# Patient Record
Sex: Male | Born: 1960 | Marital: Single
Health system: Southern US, Community
[De-identification: ages and names within clinical notes are randomized; demographics above are authoritative.]

---

## 2007-10-02 ENCOUNTER — Other Ambulatory Visit: Payer: Self-pay

## 2007-10-02 ENCOUNTER — Inpatient Hospital Stay: Payer: Self-pay | Admitting: *Deleted

## 2007-10-03 ENCOUNTER — Other Ambulatory Visit: Payer: Self-pay

## 2007-10-17 ENCOUNTER — Ambulatory Visit: Payer: Self-pay | Admitting: Family

## 2011-07-06 ENCOUNTER — Inpatient Hospital Stay: Payer: Self-pay | Admitting: Internal Medicine

## 2011-08-13 ENCOUNTER — Emergency Department: Payer: Self-pay | Admitting: Emergency Medicine

## 2011-08-13 LAB — URINALYSIS, COMPLETE
Bacteria: NONE SEEN
Blood: NEGATIVE
Glucose,UR: NEGATIVE mg/dL (ref 0–75)
Ketone: NEGATIVE
Leukocyte Esterase: NEGATIVE
Nitrite: NEGATIVE
Protein: NEGATIVE
RBC,UR: 1 /HPF (ref 0–5)
Squamous Epithelial: NONE SEEN

## 2011-08-13 LAB — CBC
HGB: 14.6 g/dL (ref 13.0–18.0)
MCHC: 33.2 g/dL (ref 32.0–36.0)
Platelet: 149 10*3/uL — ABNORMAL LOW (ref 150–440)
RBC: 4.52 10*6/uL (ref 4.40–5.90)
RDW: 15.1 % — ABNORMAL HIGH (ref 11.5–14.5)
WBC: 12.3 10*3/uL — ABNORMAL HIGH (ref 3.8–10.6)

## 2011-08-13 LAB — COMPREHENSIVE METABOLIC PANEL
Alkaline Phosphatase: 46 U/L — ABNORMAL LOW (ref 50–136)
Anion Gap: 10 (ref 7–16)
Bilirubin,Total: 1.9 mg/dL — ABNORMAL HIGH (ref 0.2–1.0)
Calcium, Total: 8.9 mg/dL (ref 8.5–10.1)
Co2: 29 mmol/L (ref 21–32)
Creatinine: 1.37 mg/dL — ABNORMAL HIGH (ref 0.60–1.30)
EGFR (African American): >60
EGFR (Non-African Amer.): 59 — ABNORMAL LOW
Glucose: 103 mg/dL — ABNORMAL HIGH (ref 65–99)
Sodium: 138 mmol/L (ref 136–145)

## 2011-08-13 LAB — PRO B NATRIURETIC PEPTIDE: B-Type Natriuretic Peptide: 7681 pg/mL — ABNORMAL HIGH (ref 0–125)

## 2011-08-13 LAB — CK TOTAL AND CKMB (NOT AT ARMC): CK, Total: 109 U/L (ref 35–232)

## 2011-08-13 LAB — DIGOXIN LEVEL: Digoxin: 1.59 ng/mL

## 2011-10-08 ENCOUNTER — Inpatient Hospital Stay: Payer: Self-pay | Admitting: Internal Medicine

## 2011-10-08 LAB — COMPREHENSIVE METABOLIC PANEL
Albumin: 3.6 g/dL (ref 3.4–5.0)
Alkaline Phosphatase: 64 U/L (ref 50–136)
Anion Gap: 11 (ref 7–16)
BUN: 27 mg/dL — ABNORMAL HIGH (ref 7–18)
Creatinine: 1.49 mg/dL — ABNORMAL HIGH (ref 0.60–1.30)
Glucose: 137 mg/dL — ABNORMAL HIGH (ref 65–99)
Osmolality: 277 (ref 275–301)
Potassium: 4.4 mmol/L (ref 3.5–5.1)
SGOT(AST): 201 U/L — ABNORMAL HIGH (ref 15–37)
Sodium: 135 mmol/L — ABNORMAL LOW (ref 136–145)
Total Protein: 6.9 g/dL (ref 6.4–8.2)

## 2011-10-08 LAB — CBC
HCT: 42.5 % (ref 40.0–52.0)
HGB: 13.7 g/dL (ref 13.0–18.0)
MCHC: 32.2 g/dL (ref 32.0–36.0)
MCV: 96 fL (ref 80–100)
Platelet: 190 10*3/uL (ref 150–440)
RDW: 17.7 % — ABNORMAL HIGH (ref 11.5–14.5)
WBC: 9.9 10*3/uL (ref 3.8–10.6)

## 2011-10-08 LAB — URINALYSIS, COMPLETE
Blood: NEGATIVE
Leukocyte Esterase: NEGATIVE
Nitrite: NEGATIVE
Ph: 5 (ref 4.5–8.0)
Protein: 30
RBC,UR: <1 /HPF (ref 0–5)

## 2011-10-08 LAB — RAPID INFLUENZA A&B ANTIGENS

## 2011-10-08 LAB — CK TOTAL AND CKMB (NOT AT ARMC)
CK, Total: 127 U/L (ref 35–232)
CK-MB: 1.8 ng/mL (ref 0.5–3.6)

## 2011-10-08 LAB — DIGOXIN LEVEL: Digoxin: 0.96 ng/mL

## 2011-10-08 LAB — TROPONIN I: Troponin-I: 0.03 ng/mL

## 2011-10-09 LAB — COMPREHENSIVE METABOLIC PANEL
Albumin: 3.1 g/dL — ABNORMAL LOW (ref 3.4–5.0)
Alkaline Phosphatase: 55 U/L (ref 50–136)
Anion Gap: 13 (ref 7–16)
BUN: 27 mg/dL — ABNORMAL HIGH (ref 7–18)
Bilirubin,Total: 2.4 mg/dL — ABNORMAL HIGH (ref 0.2–1.0)
Creatinine: 1.56 mg/dL — ABNORMAL HIGH (ref 0.60–1.30)
EGFR (Non-African Amer.): 51 — ABNORMAL LOW
Osmolality: 283 (ref 275–301)
Sodium: 139 mmol/L (ref 136–145)

## 2011-10-09 LAB — BASIC METABOLIC PANEL
Anion Gap: 13 (ref 7–16)
BUN: 28 mg/dL — ABNORMAL HIGH (ref 7–18)
Chloride: 99 mmol/L (ref 98–107)
Co2: 26 mmol/L (ref 21–32)
Creatinine: 1.46 mg/dL — ABNORMAL HIGH (ref 0.60–1.30)
EGFR (African American): >60
Glucose: 104 mg/dL — ABNORMAL HIGH (ref 65–99)

## 2011-10-09 LAB — CBC WITH DIFFERENTIAL/PLATELET
Basophil #: 0.1 10*3/uL (ref 0.0–0.1)
Basophil %: 0.9 %
Eosinophil #: 0.1 10*3/uL (ref 0.0–0.7)
HGB: 12.7 g/dL — ABNORMAL LOW (ref 13.0–18.0)
Lymphocyte %: 25.4 %
MCH: 30.8 pg (ref 26.0–34.0)
MCHC: 32.5 g/dL (ref 32.0–36.0)
Monocyte #: 0.9 10*3/uL — ABNORMAL HIGH (ref 0.0–0.7)
Neutrophil %: 62.9 %
Platelet: 170 10*3/uL (ref 150–440)
RDW: 17.3 % — ABNORMAL HIGH (ref 11.5–14.5)

## 2011-10-09 LAB — CK TOTAL AND CKMB (NOT AT ARMC)
CK, Total: 123 U/L (ref 35–232)
CK-MB: 1.3 ng/mL (ref 0.5–3.6)

## 2011-10-09 LAB — MAGNESIUM: Magnesium: 1.4 mg/dL — ABNORMAL LOW

## 2011-10-09 LAB — TROPONIN I: Troponin-I: 0.03 ng/mL

## 2011-10-10 LAB — CK TOTAL AND CKMB (NOT AT ARMC): CK-MB: 1.1 ng/mL (ref 0.5–3.6)

## 2011-10-12 LAB — CREATININE, SERUM
EGFR (African American): 54 — ABNORMAL LOW
EGFR (Non-African Amer.): 44 — ABNORMAL LOW

## 2011-10-13 LAB — BASIC METABOLIC PANEL
Anion Gap: 16 (ref 7–16)
BUN: 48 mg/dL — ABNORMAL HIGH (ref 7–18)
Chloride: 96 mmol/L — ABNORMAL LOW (ref 98–107)
Creatinine: 2.24 mg/dL — ABNORMAL HIGH (ref 0.60–1.30)
EGFR (Non-African Amer.): 33 — ABNORMAL LOW
Glucose: 153 mg/dL — ABNORMAL HIGH (ref 65–99)
Sodium: 133 mmol/L — ABNORMAL LOW (ref 136–145)

## 2011-10-14 LAB — BASIC METABOLIC PANEL
Anion Gap: 20 — ABNORMAL HIGH (ref 7–16)
BUN: 61 mg/dL — ABNORMAL HIGH (ref 7–18)
BUN: 62 mg/dL — ABNORMAL HIGH (ref 7–18)
Calcium, Total: 8.3 mg/dL — ABNORMAL LOW (ref 8.5–10.1)
Calcium, Total: 8.3 mg/dL — ABNORMAL LOW (ref 8.5–10.1)
Co2: 14 mmol/L — ABNORMAL LOW (ref 21–32)
Co2: 20 mmol/L — ABNORMAL LOW (ref 21–32)
EGFR (African American): 37 — ABNORMAL LOW
EGFR (African American): 45 — ABNORMAL LOW
Glucose: 104 mg/dL — ABNORMAL HIGH (ref 65–99)
Osmolality: 278 (ref 275–301)
Osmolality: 279 (ref 275–301)
Potassium: 4.2 mmol/L (ref 3.5–5.1)
Sodium: 130 mmol/L — ABNORMAL LOW (ref 136–145)
Sodium: 129 mmol/L — ABNORMAL LOW (ref 136–145)

## 2011-10-14 LAB — PROTEIN / CREATININE RATIO, URINE
Protein, Random Urine: 53 mg/dL — ABNORMAL HIGH (ref 0–12)
Protein/Creat. Ratio: 211 mg/gCREAT — ABNORMAL HIGH (ref 0–200)

## 2011-10-15 LAB — BASIC METABOLIC PANEL
Anion Gap: 14 (ref 7–16)
BUN: 52 mg/dL — ABNORMAL HIGH (ref 7–18)
Calcium, Total: 8.3 mg/dL — ABNORMAL LOW (ref 8.5–10.1)
Chloride: 96 mmol/L — ABNORMAL LOW (ref 98–107)
Co2: 21 mmol/L (ref 21–32)
Creatinine: 1.55 mg/dL — ABNORMAL HIGH (ref 0.60–1.30)
EGFR (Non-African Amer.): 51 — ABNORMAL LOW
Potassium: 3.7 mmol/L (ref 3.5–5.1)
Sodium: 131 mmol/L — ABNORMAL LOW (ref 136–145)

## 2011-10-15 LAB — STOOL CULTURE

## 2011-10-16 LAB — CBC WITH DIFFERENTIAL/PLATELET
Basophil #: 0 10*3/uL (ref 0.0–0.1)
Eosinophil #: 0.1 10*3/uL (ref 0.0–0.7)
Eosinophil %: 0.8 %
HCT: 42.3 % (ref 40.0–52.0)
Lymphocyte #: 2.8 10*3/uL (ref 1.0–3.6)
MCH: 30.5 pg (ref 26.0–34.0)
MCV: 95 fL (ref 80–100)
Monocyte #: 1.8 10*3/uL — ABNORMAL HIGH (ref 0.0–0.7)
Monocyte %: 12.7 %
Neutrophil #: 9.3 10*3/uL — ABNORMAL HIGH (ref 1.4–6.5)
Neutrophil %: 66.3 %
RBC: 4.48 10*6/uL (ref 4.40–5.90)
RDW: 18.3 % — ABNORMAL HIGH (ref 11.5–14.5)
WBC: 14 10*3/uL — ABNORMAL HIGH (ref 3.8–10.6)

## 2011-10-16 LAB — BASIC METABOLIC PANEL
Anion Gap: 13 (ref 7–16)
Calcium, Total: 8.5 mg/dL (ref 8.5–10.1)
Chloride: 96 mmol/L — ABNORMAL LOW (ref 98–107)
Creatinine: 1.6 mg/dL — ABNORMAL HIGH (ref 0.60–1.30)
EGFR (African American): 60 — ABNORMAL LOW
EGFR (Non-African Amer.): 49 — ABNORMAL LOW
Glucose: 101 mg/dL — ABNORMAL HIGH (ref 65–99)
Sodium: 130 mmol/L — ABNORMAL LOW (ref 136–145)

## 2011-10-17 LAB — BASIC METABOLIC PANEL
BUN: 54 mg/dL — ABNORMAL HIGH (ref 7–18)
Chloride: 94 mmol/L — ABNORMAL LOW (ref 98–107)
Co2: 21 mmol/L (ref 21–32)
Creatinine: 1.66 mg/dL — ABNORMAL HIGH (ref 0.60–1.30)
EGFR (African American): 57 — ABNORMAL LOW
Glucose: 109 mg/dL — ABNORMAL HIGH (ref 65–99)
Osmolality: 271 (ref 275–301)
Sodium: 127 mmol/L — ABNORMAL LOW (ref 136–145)

## 2011-10-18 LAB — PROTEIN ELECTROPHORESIS(ARMC)

## 2011-10-18 LAB — BASIC METABOLIC PANEL
Anion Gap: 12 (ref 7–16)
BUN: 54 mg/dL — ABNORMAL HIGH (ref 7–18)
Calcium, Total: 8.5 mg/dL (ref 8.5–10.1)
Chloride: 93 mmol/L — ABNORMAL LOW (ref 98–107)
Glucose: 111 mg/dL — ABNORMAL HIGH (ref 65–99)

## 2011-10-18 LAB — UR PROT ELECTROPHORESIS, URINE RANDOM

## 2011-10-19 LAB — BASIC METABOLIC PANEL
Chloride: 92 mmol/L — ABNORMAL LOW (ref 98–107)
Co2: 23 mmol/L (ref 21–32)
Creatinine: 1.72 mg/dL — ABNORMAL HIGH (ref 0.60–1.30)
EGFR (African American): 55 — ABNORMAL LOW
EGFR (Non-African Amer.): 45 — ABNORMAL LOW
Glucose: 102 mg/dL — ABNORMAL HIGH (ref 65–99)
Osmolality: 268 (ref 275–301)
Potassium: 4.8 mmol/L (ref 3.5–5.1)
Sodium: 126 mmol/L — ABNORMAL LOW (ref 136–145)

## 2011-10-19 LAB — RAPID HIV-1/2 QL/CONFIRM: HIV-1/2,Rapid Ql: NEGATIVE

## 2011-10-20 LAB — BASIC METABOLIC PANEL
Anion Gap: 16 (ref 7–16)
Chloride: 94 mmol/L — ABNORMAL LOW (ref 98–107)
Co2: 13 mmol/L — ABNORMAL LOW (ref 21–32)
Creatinine: 1.51 mg/dL — ABNORMAL HIGH (ref 0.60–1.30)
EGFR (African American): >60
EGFR (Non-African Amer.): 53 — ABNORMAL LOW
Potassium: 5.7 mmol/L — ABNORMAL HIGH (ref 3.5–5.1)
Sodium: 123 mmol/L — ABNORMAL LOW (ref 136–145)

## 2011-10-21 LAB — BASIC METABOLIC PANEL
Anion Gap: 17 — ABNORMAL HIGH (ref 7–16)
BUN: 63 mg/dL — ABNORMAL HIGH (ref 7–18)
Calcium, Total: 8.4 mg/dL — ABNORMAL LOW (ref 8.5–10.1)
Chloride: 91 mmol/L — ABNORMAL LOW (ref 98–107)
Co2: 13 mmol/L — ABNORMAL LOW (ref 21–32)
Creatinine: 2.06 mg/dL — ABNORMAL HIGH (ref 0.60–1.30)
EGFR (African American): 45 — ABNORMAL LOW
EGFR (Non-African Amer.): 37 — ABNORMAL LOW
Osmolality: 262 (ref 275–301)
Potassium: 5.7 mmol/L — ABNORMAL HIGH (ref 3.5–5.1)
Sodium: 121 mmol/L — ABNORMAL LOW (ref 136–145)

## 2011-10-21 LAB — CULTURE, BLOOD (SINGLE)

## 2011-10-22 LAB — COMPREHENSIVE METABOLIC PANEL
Anion Gap: 13 (ref 7–16)
BUN: 65 mg/dL — ABNORMAL HIGH (ref 7–18)
Bilirubin,Total: 5 mg/dL — ABNORMAL HIGH (ref 0.2–1.0)
Calcium, Total: 8 mg/dL — ABNORMAL LOW (ref 8.5–10.1)
Chloride: 90 mmol/L — ABNORMAL LOW (ref 98–107)
Creatinine: 2.2 mg/dL — ABNORMAL HIGH (ref 0.60–1.30)
EGFR (African American): 41 — ABNORMAL LOW
EGFR (Non-African Amer.): 34 — ABNORMAL LOW
Glucose: 150 mg/dL — ABNORMAL HIGH (ref 65–99)
Potassium: 5.1 mmol/L (ref 3.5–5.1)
SGOT(AST): 753 U/L — ABNORMAL HIGH (ref 15–37)
SGPT (ALT): 893 U/L — ABNORMAL HIGH
Sodium: 125 mmol/L — ABNORMAL LOW (ref 136–145)
Total Protein: 5.9 g/dL — ABNORMAL LOW (ref 6.4–8.2)

## 2011-10-22 LAB — CBC WITH DIFFERENTIAL/PLATELET
Basophil %: 0.4 %
Eosinophil #: 0 10*3/uL (ref 0.0–0.7)
HCT: 39.9 % — ABNORMAL LOW (ref 40.0–52.0)
HGB: 12.8 g/dL — ABNORMAL LOW (ref 13.0–18.0)
Lymphocyte #: 1.2 10*3/uL (ref 1.0–3.6)
MCHC: 32.2 g/dL (ref 32.0–36.0)
MCV: 93 fL (ref 80–100)
Monocyte #: 0.8 10*3/uL — ABNORMAL HIGH (ref 0.0–0.7)
Monocyte %: 5.5 %
Neutrophil %: 85.9 %
Platelet: 123 10*3/uL — ABNORMAL LOW (ref 150–440)
RBC: 4.3 10*6/uL — ABNORMAL LOW (ref 4.40–5.90)
WBC: 14.8 10*3/uL — ABNORMAL HIGH (ref 3.8–10.6)

## 2011-10-22 LAB — SEDIMENTATION RATE: Erythrocyte Sed Rate: 1 mm/hr (ref 0–15)

## 2011-10-23 LAB — BASIC METABOLIC PANEL
BUN: 48 mg/dL — ABNORMAL HIGH (ref 7–18)
Calcium, Total: 8 mg/dL — ABNORMAL LOW (ref 8.5–10.1)
Chloride: 91 mmol/L — ABNORMAL LOW (ref 98–107)
EGFR (Non-African Amer.): 48 — ABNORMAL LOW
Osmolality: 272 (ref 275–301)
Potassium: 5.1 mmol/L (ref 3.5–5.1)

## 2011-10-23 LAB — EXPECTORATED SPUTUM ASSESSMENT W GRAM STAIN, RFLX TO RESP C

## 2011-10-23 LAB — CBC WITH DIFFERENTIAL/PLATELET
Basophil #: 0 10*3/uL (ref 0.0–0.1)
Basophil %: 0.1 %
HGB: 12.3 g/dL — ABNORMAL LOW (ref 13.0–18.0)
Lymphocyte #: 1.3 10*3/uL (ref 1.0–3.6)
Lymphocyte %: 6.1 %
MCH: 29.8 pg (ref 26.0–34.0)
MCV: 92 fL (ref 80–100)
Monocyte #: 1.8 10*3/uL — ABNORMAL HIGH (ref 0.0–0.7)
Monocyte %: 8.4 %
Platelet: 84 10*3/uL — ABNORMAL LOW (ref 150–440)
RBC: 4.13 10*6/uL — ABNORMAL LOW (ref 4.40–5.90)
RDW: 18.6 % — ABNORMAL HIGH (ref 11.5–14.5)
WBC: 21.4 10*3/uL — ABNORMAL HIGH (ref 3.8–10.6)

## 2011-10-24 ENCOUNTER — Ambulatory Visit: Payer: Self-pay | Admitting: Neurology

## 2011-10-24 LAB — RENAL FUNCTION PANEL
Albumin: 2.7 g/dL — ABNORMAL LOW (ref 3.4–5.0)
Anion Gap: 11 (ref 7–16)
BUN: 54 mg/dL — ABNORMAL HIGH (ref 7–18)
Chloride: 92 mmol/L — ABNORMAL LOW (ref 98–107)
Creatinine: 1.92 mg/dL — ABNORMAL HIGH (ref 0.60–1.30)
EGFR (Non-African Amer.): 40 — ABNORMAL LOW
Glucose: 130 mg/dL — ABNORMAL HIGH (ref 65–99)
Osmolality: 275 (ref 275–301)
Phosphorus: 4.5 mg/dL (ref 2.5–4.9)
Potassium: 5.9 mmol/L — ABNORMAL HIGH (ref 3.5–5.1)

## 2011-10-24 LAB — IRON AND TIBC: Iron Bind.Cap.(Total): 303 ug/dL (ref 250–450)

## 2011-10-24 LAB — CBC WITH DIFFERENTIAL/PLATELET
Basophil #: 0 10*3/uL (ref 0.0–0.1)
Eosinophil %: 0.1 %
HCT: 41.5 % (ref 40.0–52.0)
HGB: 13 g/dL (ref 13.0–18.0)
Lymphocyte #: 1.7 10*3/uL (ref 1.0–3.6)
Lymphocyte %: 6.8 %
Monocyte %: 5.1 %
Neutrophil %: 87.9 %
Platelet: 70 10*3/uL — ABNORMAL LOW (ref 150–440)
RDW: 17.8 % — ABNORMAL HIGH (ref 11.5–14.5)

## 2011-10-24 LAB — PROTIME-INR
INR: 1.7
Prothrombin Time: 20.5 secs — ABNORMAL HIGH (ref 11.5–14.7)

## 2011-10-24 LAB — URINALYSIS, COMPLETE
Bilirubin,UR: NEGATIVE
Blood: NEGATIVE
Glucose,UR: NEGATIVE mg/dL (ref 0–75)
Ketone: NEGATIVE
Leukocyte Esterase: NEGATIVE
Ph: 5 (ref 4.5–8.0)
Protein: NEGATIVE
RBC,UR: <1 /HPF (ref 0–5)
Squamous Epithelial: <1
WBC UR: 1 /HPF (ref 0–5)

## 2011-10-24 LAB — COMPREHENSIVE METABOLIC PANEL
Anion Gap: 12 (ref 7–16)
BUN: 54 mg/dL — ABNORMAL HIGH (ref 7–18)
Chloride: 91 mmol/L — ABNORMAL LOW (ref 98–107)
Creatinine: 1.93 mg/dL — ABNORMAL HIGH (ref 0.60–1.30)
EGFR (African American): 48 — ABNORMAL LOW
Glucose: 129 mg/dL — ABNORMAL HIGH (ref 65–99)
SGOT(AST): 254 U/L — ABNORMAL HIGH (ref 15–37)
SGPT (ALT): 567 U/L — ABNORMAL HIGH
Sodium: 129 mmol/L — ABNORMAL LOW (ref 136–145)
Total Protein: 6.2 g/dL — ABNORMAL LOW (ref 6.4–8.2)

## 2011-10-24 LAB — FIBRINOGEN: Fibrinogen: 193 mg/dL — ABNORMAL LOW (ref 210–470)

## 2011-10-25 LAB — COMPREHENSIVE METABOLIC PANEL
Albumin: 2.5 g/dL — ABNORMAL LOW (ref 3.4–5.0)
Calcium, Total: 8 mg/dL — ABNORMAL LOW (ref 8.5–10.1)
Chloride: 88 mmol/L — ABNORMAL LOW (ref 98–107)
Creatinine: 1.9 mg/dL — ABNORMAL HIGH (ref 0.60–1.30)
EGFR (African American): 49 — ABNORMAL LOW
Osmolality: 276 (ref 275–301)
Potassium: 5.2 mmol/L — ABNORMAL HIGH (ref 3.5–5.1)
SGOT(AST): 175 U/L — ABNORMAL HIGH (ref 15–37)
Sodium: 127 mmol/L — ABNORMAL LOW (ref 136–145)
Total Protein: 5.8 g/dL — ABNORMAL LOW (ref 6.4–8.2)

## 2011-10-25 LAB — CBC WITH DIFFERENTIAL/PLATELET
Eosinophil #: 0 10*3/uL (ref 0.0–0.7)
HCT: 38 % — ABNORMAL LOW (ref 40.0–52.0)
Lymphocyte %: 5.6 %
MCH: 29.6 pg (ref 26.0–34.0)
Monocyte #: 1.4 10*3/uL — ABNORMAL HIGH (ref 0.0–0.7)
Monocyte %: 6.2 %
Neutrophil #: 20.1 10*3/uL — ABNORMAL HIGH (ref 1.4–6.5)
Neutrophil %: 88 %
RBC: 4.11 10*6/uL — ABNORMAL LOW (ref 4.40–5.90)

## 2011-10-25 LAB — RENAL FUNCTION PANEL: Phosphorus: 4.2 mg/dL (ref 2.5–4.9)

## 2011-11-01 LAB — CULTURE, BLOOD (SINGLE)

## 2011-11-15 LAB — CULTURE, FUNGUS WITHOUT SMEAR

## 2013-08-30 IMAGING — CT CT CHEST W/O CM
2 series · 16 of 31 positions shown, 20 images · non-contrast
Comparison: none

REASON FOR EXAM: hemoptysis
COMMENTS:

PROCEDURE:     CT  - CT CHEST WITHOUT CONTRAST  - October 15, 2011 [DATE]
RESULT:     History: CHF. Hemoptysis.
Comparison Study: CT of 08/13/2011.

[Series 2: soft tissue · axial · 0.81mm/px · z∈[-239,-119]mm · 3 of 81 slices shown]
[im 7/81  mediastinal]
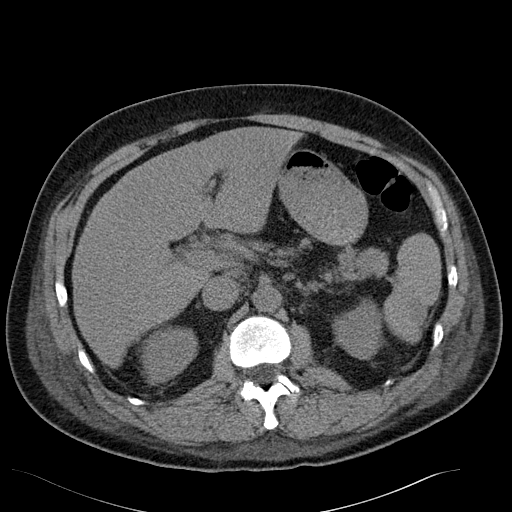
[im 19/81  mediastinal]
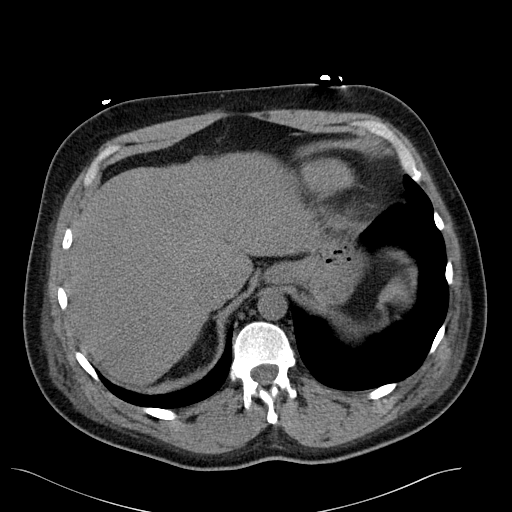
[im 31/81  mediastinal]
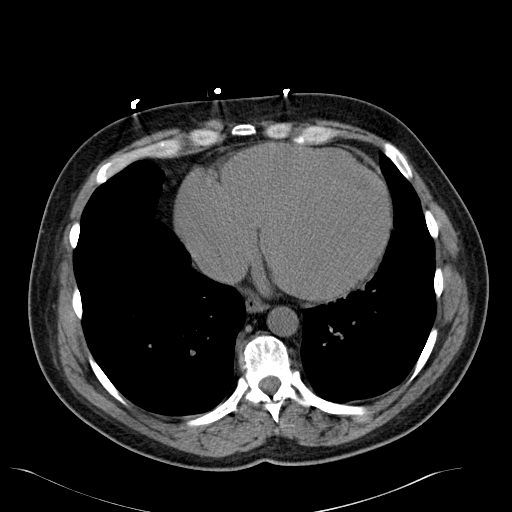

[Series 3: lung windows · axial · 0.78mm/px · z∈[-229,+101]mm · 13 of 79 slices shown, 17 images]
[im 7/79  mediastinal]
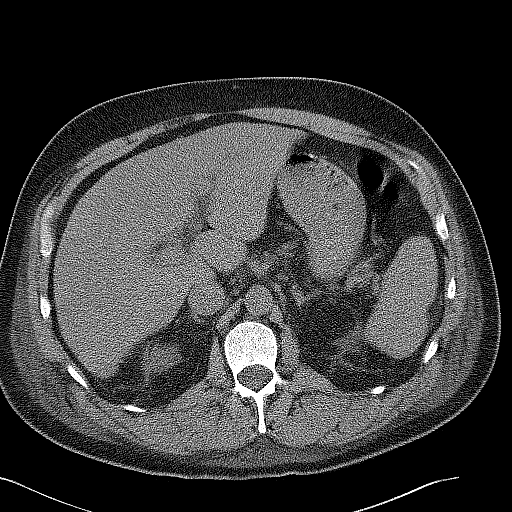
[im 7/79  lung]
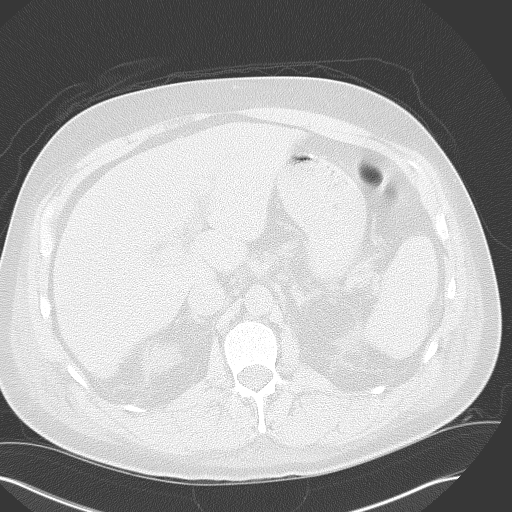
[im 13/79  lung]
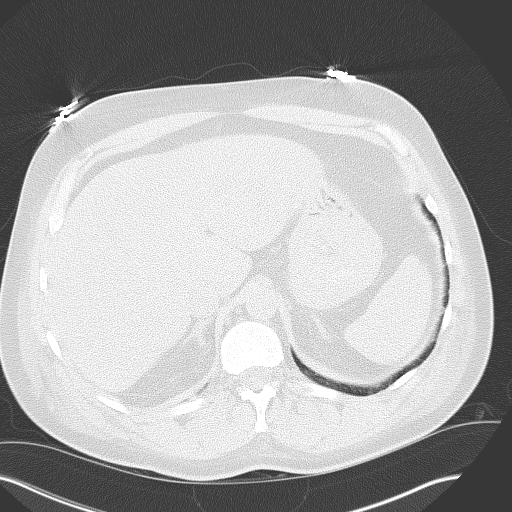
[im 19/79  lung]
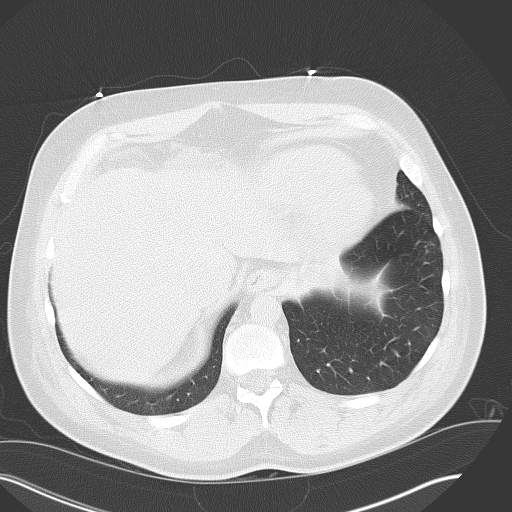
[im 25/79  lung]
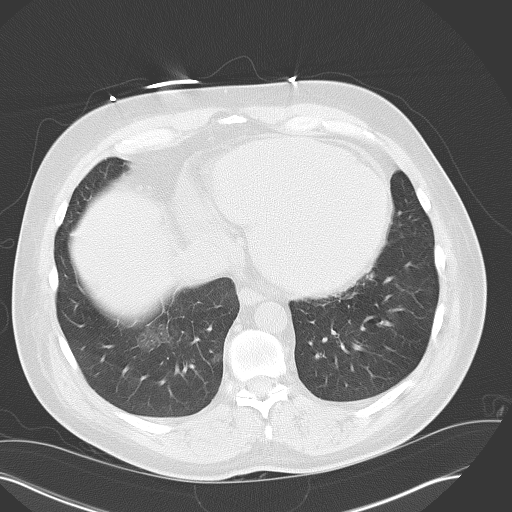
[im 31/79  mediastinal]
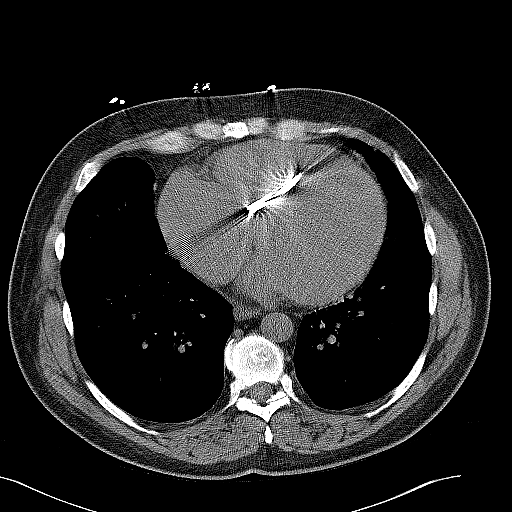
[im 31/79  lung]
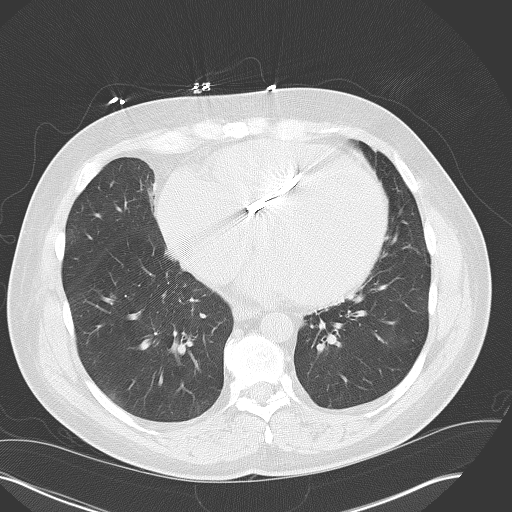
[im 37/79  lung]
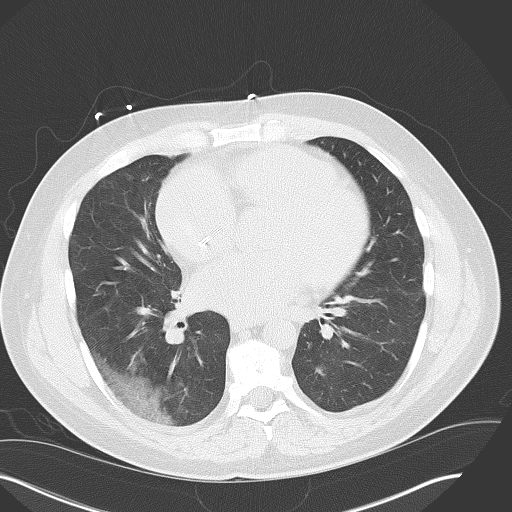
[im 40/79  lung]
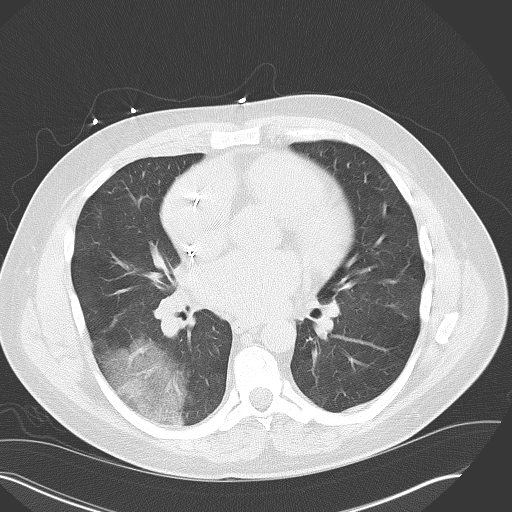
[im 43/79  lung]
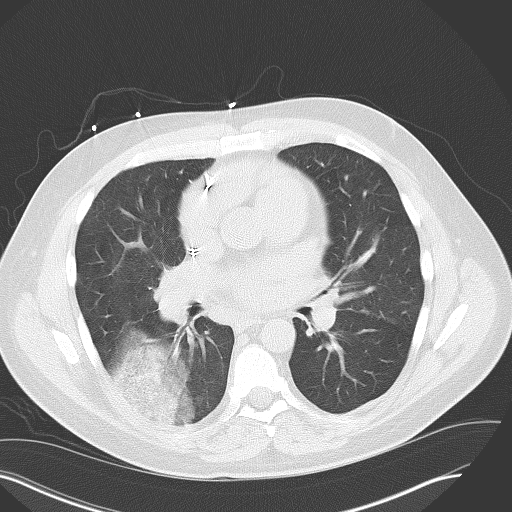
[im 49/79  mediastinal]
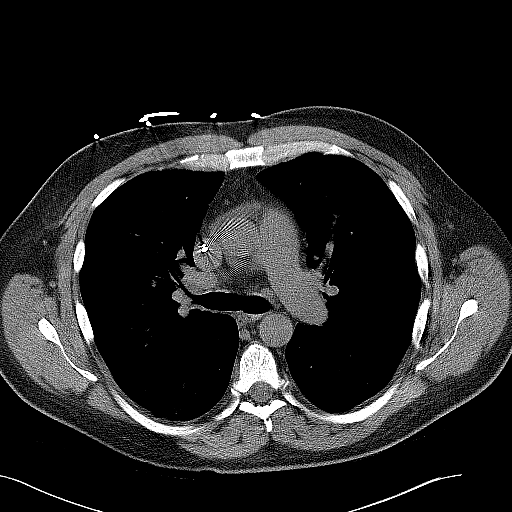
[im 49/79  lung]
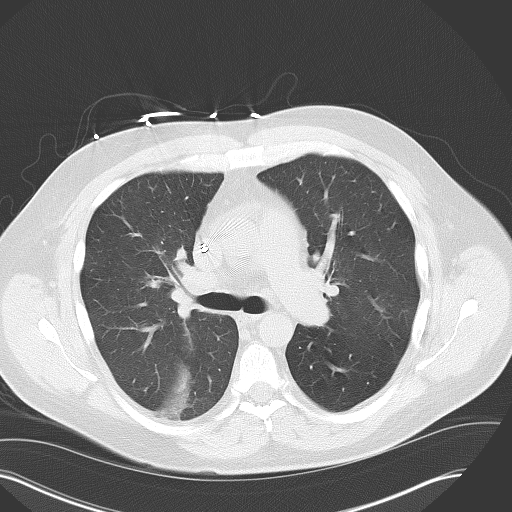
[im 55/79  lung]
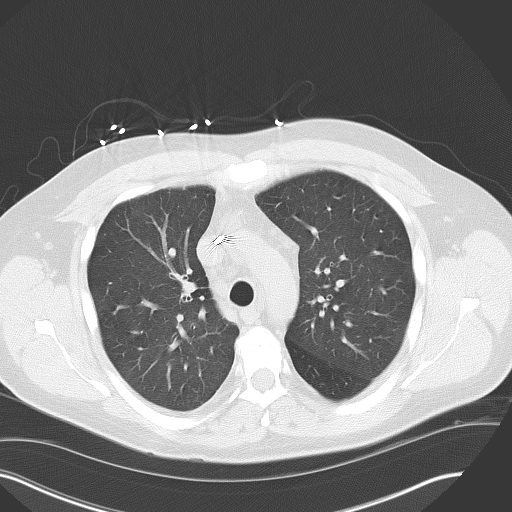
[im 61/79  lung]
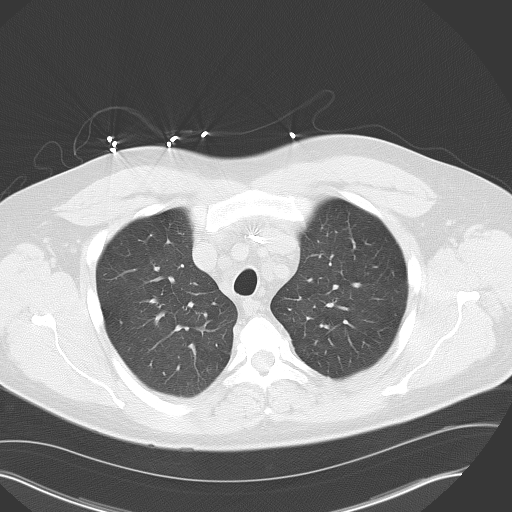
[im 67/79  lung]
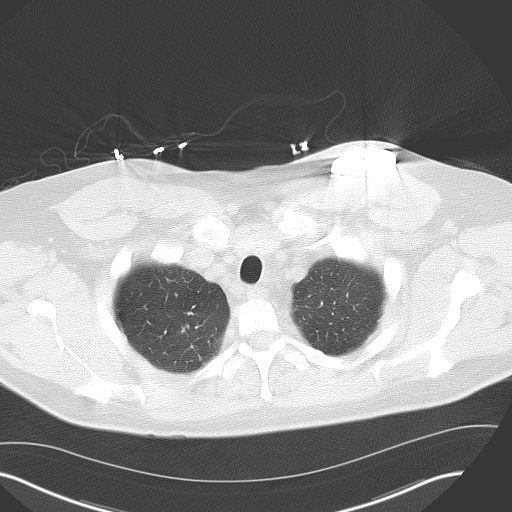
[im 73/79  mediastinal]
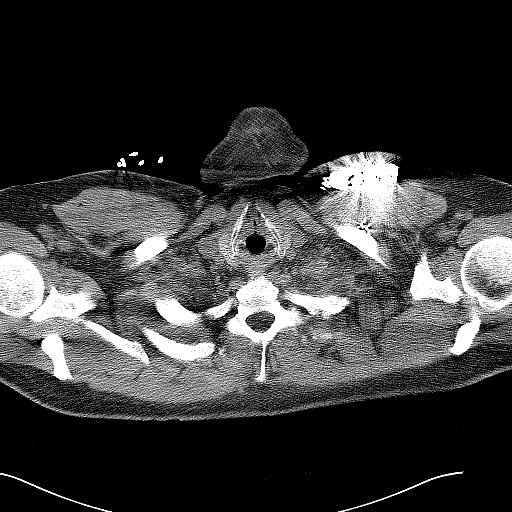
[im 73/79  lung]
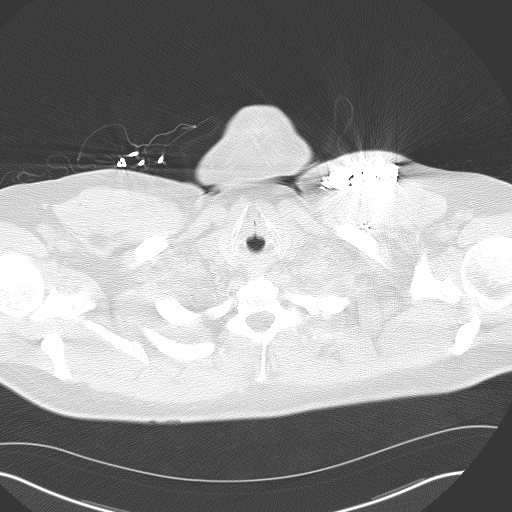

[16 of 31 positions shown; findings below may reference images not displayed]

FINDINGS: A standard nonenhanced CT obtained and reveals multiple
mediastinal lymph nodes measuring up to 1.7 cm. These have increased in size
and number from prior scan scan and could be inflammatory, infectious, or
malignant. Coronary artery disease present. Cardiomegaly present. Cardiac
pacer noted. Adrenals normal. Dense right lower lobe infiltrate is noted
most consistent with pneumonia. Infarct and malignancy cannot be completely
excluded.  Large airways are patent. Previously identified left lower lobe
infiltrate is clear.
IMPRESSION: 1. New large right lower lobe infiltrate consistent with pneumonia.
2. Mediastinal lymphadenopathy.
3. Cardiomegaly. Coronary artery disease. Cardiac pacer.

## 2013-09-05 IMAGING — CR DG CHEST 1V PORT
1 series · 1 of 1 positions shown · non-contrast
Comparison: none

REASON FOR EXAM: central line placement
COMMENTS:

[portable]
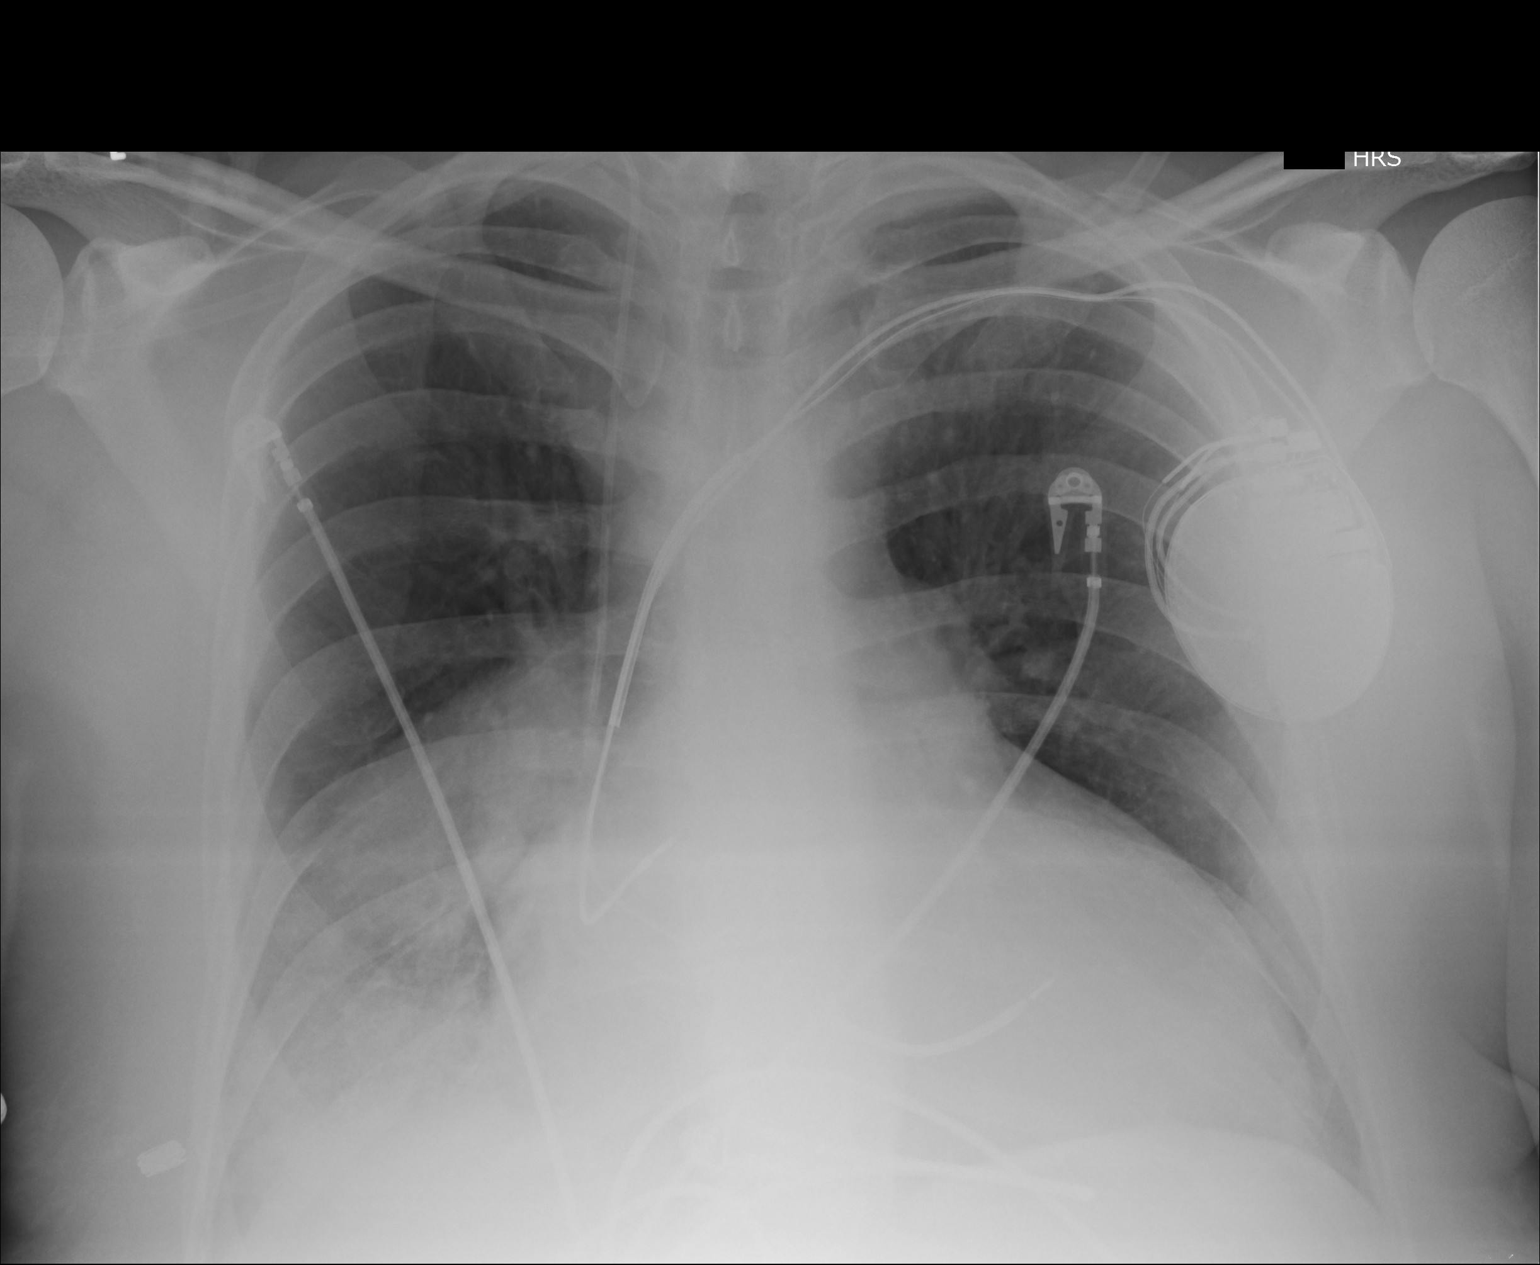

[1 of 1 positions shown; findings below may reference images not displayed]

PROCEDURE:     DXR - DXR PORTABLE CHEST SINGLE VIEW  - October 21, 2011  [DATE]

RESULT:     Comparison is made to the study October 18, 2011.

The density in the right mid and lower lung has progressed such that much of
the lower hemithorax is opacified. The left hemidiaphragm is less well seen
today. The cardiac silhouette remains enlarged. The pulmonary vascularity is
prominent centrally though stable. A permanent pacemaker is stable in
position. There is a right internal jugular venous catheter in place with
tip lies in the region of the mid to distal SVC. I see no postprocedure
complication.
IMPRESSION: The findings suggest progressive atelectasis or pneumonia
in the right lower lobe. I see no postprocedure complication following
placement of the right internal jugular venous catheter.
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
## 2014-11-10 NOTE — Consult Note (Signed)
Reason for Consult:  Admit Date: 08-Oct-2011   Chief Complaint: spell with eyes rolling up   Reason for Consult: altered mental status   History of Present Illness:  History of Present Illness:   Dr. Mina Marble asked me to take over care of this patient. Most of the history was obtained from his note and key points confirmed with patient. altered mental status. Ronald Oliver is a 53 year-old make with a past medical history significant for nonischemic cardiomyopathy with ejection fraction of 15%, status post automatic implantable cardiac defibrillator, hypertension, asthma, CHF, and hyperlipidemia who was admitted on 10/08/2011 for shortness of breath, chest pain, and acute renal failure. 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 The patient had an episode last night, witnessed by his nurse, in which he transiently lost conciousness (the witnessing nurse was not available to provide any history, but the covering nurse for today obtained some history from the witnessing nurse and was able to rpovide this history for me). Per report, the patient's eyes rolled in the back of his head, and he became unresponsive. It's unclear how long the episode lasted for, though it appears to have been only a short amount of time. No convulsions were noted during this episode. Per the patient, he had been experiencing some severe back pain prior to losing conciousness. He then lost conciousness without any warning, and when he woke back up, he noted that the nurse was "pounding on my chest." He initially felt a little bit confused, but quickly was able to re-orient himself. The patient reports that these episodes happen frequently, though last night's episode was the worst one he has experienced. He notes that about 9 months ago, he began to have episodes in which he would feel lightheaded and  wobbly. Knowing that he was about to lose conciousness, he would try to sit down and relax; sometimes, the symptoms would pass without incident, while other times, he sould lose consiousness for about 1-2 seconds, then would quickly regain conciousness. The patient denies ever having any tongue-biting or bowel/bladder incontinence associated with these episodes. He would feel weak after a typical episode, but denies any muscle soreness. The episodes sometimes occur with shifts in position (e.g. going from sitting to standing position), but sometimes occurs even when he has been in the same position for a while. No symptoms of chest pain or palpitations associated with these episodes. He notes that these episodes occur about three times per week, and they have been becoming more frequent over time.   ROS:   General pain  fatigue    HEENT no complaints    Lungs SOB    Cardiac no complaints    GI constipation    GU no complaints    Musculoskeletal back pain    Extremities swelling    Skin no complaints    Neuro no complaints    Endocrine no complaints    Psych no complaints   Past Medical/Surgical Hx:  Other, see comments: defibrilator  CHF:   Pneumothorax L:   Asthma:   Past Medical/ Surgical Hx:   Past Medical History Nonischemic cardiomyopathy with ejection fraction of 15%, status post automatic implantable cardiac defibrillator.  Hypertension.  Asthma.  Hyperlipidemia.  History of congestive heart failure.    Past Surgical History ICD implant   Home Medications: Medication Instructions Last Modified Date/Time  digoxin 125 mcg (0.125 mg) oral tablet  take 1 tablet by mouth daily 22-Mar-13 22:56  potassium chloride 10 mEq oral tablet, extended release take 1 tablet by mouth daily 22-Mar-13 22:56  Coreg 6.25 mg oral tablet take 1 tablet by mouth every 12 hours 22-Mar-13 22:56  Ecotrin Adult Low Strength 81 mg oral enteric coated tablet take 1 tablet by mouth daily  22-Mar-13 22:56  lisinopril 10 mg oral tablet 1 tab(s) orally once a day 22-Mar-13 22:56  Lasix 40 mg oral tablet 1 tab(s) orally once a day 22-Mar-13 22:56  Nitrostat 0.4 mg sublingual tablet 1 tab(s) sublingual every 5 minutes as needed for chest pain * *no more than 3 in 61mns** 22-Mar-13 22:56  Centrum Flavor Burst oral tablet, chewable 1 tab(s) orally once a day 22-Mar-13 22:56  Qvar 40 mcg/inh inhalation aerosol 2 puff(s) inhaled 2 times a day 22-Mar-13 22:56  Ventolin HFA 2 puff(s) inhaled every 4 hours, As Needed 22-Mar-13 22:56   Allergies:  No Known Allergies:   Social/Family History:  Social History: Lives alone. Unemployed. Denies tobacco use, alcohol use, or illicit drug use.   Family History: Notable for heart disease. No history of seizuresin family.   Vital Signs: **Vital Signs.:   08-Apr-13 20:18   Vital Signs Type Routine   Temperature Temperature (F) 95.8   Celsius 35.4   Temperature Source tympanic   Pulse Pulse 102   Pulse source per Dinamap   Respirations Respirations 20   Systolic BP Systolic BP 95   Diastolic BP (mmHg) Diastolic BP (mmHg) 65   Mean BP 75   BP Source Dinamap   Pulse Ox % Pulse Ox % 95   Pulse Ox Activity Level  At rest   Oxygen Delivery Room Air/ 21 %   Physical Exam:  General: No acute distress   HEENT: Atraumatic   Neck: central line in right side of neck   Chest: rales   Cardiac: distant heart sounds   Extremities: 2+ edema in lower extremties   Neurologic Exam:  Mental Status: Awake and alert, oriented to self Ronald Oliver April 2013. Attention and concentraton within normal limits. Speech is hoarse, but fluent and without dysarthria. Naming and repetition are intact.   Cranial Nerves: Visual fields intact. PERRL. EOMI.   Motor Exam: Normal bulk and tone. No pronator drift with arms. Lower extremity strength limited by pain and poor effort, grossly exhibits 3/5 iliopsoas, 4/5 foot dorsi/plantarflexion bilaterally.    Deep Tendon Reflexes: Absent throughout. Toes equivocal bilaterally.   Sensory Exam: Intact to light touch throughout.   Coordination: Finger-to-nose intact bilaterally.   Cerebellar Exam: No truncal ataxia   Gait: Deferred due to patient's complaints of pain   Lab Results: Hepatic:  08-Apr-13 05:49    Bilirubin, Total   4.8   Alkaline Phosphatase 114   SGPT (ALT)   445   SGOT (AST)   175   Total Protein, Serum   5.8   Albumin, Serum   2.5  TDMs:  22-Mar-13 12:43    Digoxin, Serum 0.96  Routine Chem:  22-Mar-13 12:43    B-Type Natriuretic Peptide ([facility]   [id number]  24-Mar-13 05:40    Magnesium, Serum 2.2  07-Apr-13 11:23    Ferritin ([facility])   433   Iron Binding Capacity (TIBC) 303   Unbound Iron Binding Capacity 273   Iron, Serum   30   Iron Saturation 10  08-Apr-13 05:49    Phosphorus, Serum 4.2   Glucose, Serum   181   BUN  58   Creatinine (comp)   1.90   Sodium, Serum   127   Potassium, Serum   5.2   Chloride, Serum   88   CO2, Serum 28   Calcium (Total), Serum   8.0   Osmolality (calc) 276   eGFR (African American)   49   eGFR (Non-African American)   40   Anion Gap 11  Misc Urine Chem:  28-Mar-13 02:27    Creatinine, Urine   251.7   Protein, Random Urine   53   Protein/Creat Ratio (comp)   211  Cardiac:  24-Mar-13 05:40    Troponin I < 0.02   CK, Total 106   CPK-MB, Serum 1.1  Routine UA:  22-Mar-13 15:59    Mucous (UA) PRESENT  07-Apr-13 10:33    Color (UA) Amber   Clarity (UA) Hazy   Glucose (UA) Negative   Bilirubin (UA) Negative   Ketones (UA) Negative   Specific Gravity (UA) 1.020   Blood (UA) Negative   pH (UA) 5.0   Protein (UA) Negative   Nitrite (UA) Negative   Leukocyte Esterase (UA) Negative   RBC (UA) <1 /HPF   WBC (UA) 1 /HPF   Bacteria (UA) TRACE   Epithelial Cells (UA) <1 /HPF   Hyaline Cast (UA) 51 /LPF  Routine Coag:  07-Apr-13 11:23    Prothrombin   20.5   INR 1.7   Activated PTT (APTT) 35.1   Fibrin Degradation  Products   > 40   Fibrinogen   193   D-Dimer, Quantitative   > 6.00  Routine Hem:  05-Apr-13 04:04    Erythrocyte Sed Rate 1  08-Apr-13 05:49    WBC (CBC)   22.9   RBC (CBC)   4.11   Hemoglobin (CBC)   12.2   Hematocrit (CBC)   38.0   Platelet Count (CBC)   55   MCV 93   MCH 29.6   MCHC 32.0   RDW   18.7   Neutrophil % 88.0   Lymphocyte % 5.6   Monocyte % 6.2   Eosinophil % 0.0   Basophil % 0.2   Neutrophil #   20.1   Lymphocyte # 1.3   Monocyte #   1.4   Eosinophil # 0.0   Basophil # 0.1   Impression/Recommendations:  Recommendations:   54 year-old make with a past medical history significant for nonischemic cardiomyopathy with ejection fraction of 15%, status post automatic implantable cardiac defibrillator, hypertension, asthma, CHF, and hyperlipidemia who was admitted on 10/08/2011 for shortness of breath, chest pain, and acute renal failure. He has had a complicated hospital course, and last night he had an episode of transient unresponsiveness. of transient alterd mental status / PRE SYCOPE: The patient reports that for the past 9 months, he has had episodes in which he feels lightheaded, then often loses conciousness for several seconds before quickly regaining conciousness. These episodes have not been associated with incontinence or tongue-biting. They have been occurring more frequently, about three times per week now. Last night, he had a witnessed episode in which he had severe back pain, then his nurse witnessed his eyes rolling in the back of his head, with the patient subsequently becoming unresponsive for a short (albeit unspecified) amount of time; after he regained conciousness, he was quickly notedto be back at his baseline mental status. The differential diagnosis includes syncope (more likely, especially given his underlying cardiac issues) vs. seizure (less likely given atypical presentation).routine EEG -  mild slowing - consistant with current medical  status. indication to start any anti-epileptic medication at this current time.on cardiac telemetry to see if there is any arrhythmia correlating with any future spells.sedating medications that can alter his mental status (e.g. patient has zolpidem on his current medication list) unless absolutely necessary Patient is being transferred to [facility] for further managment.   Electronic Signatures: Ray Church (MD)  (Signed Cecile Hearing 20:52)  Authored: Consult, History of Present Illness, Review of Systems, PAST MEDICAL/SURGICAL HISTORY, HOME MEDICATIONS, ALLERGIES, Social/Family History, NURSING VITAL SIGNS, Physical Exam-, LAB RESULTS, Recommendations   Last Updated: 08-Apr-13 20:52 by Ray Church (MD)

## 2014-11-10 NOTE — H&P (Signed)
PATIENT NAME:  Ronald Oliver, STAMBAUGH MR#:  [id number] DATE OF BIRTH:  1961/05/04  DATE OF ADMISSION:  10/08/2011  PRIMARY CARE PHYSICIAN: Does not have one.   CHIEF COMPLAINT: Chest pain, shortness of breath, and poor p.o. intake.   HISTORY OF PRESENT ILLNESS: This is a 54 year old male who comes to the Emergency Room due to shortness of breath, chest pain, and poor p.o. intake.   The patient says that he has not been feeling well for the past week to 10 days.  It developed a with a cough and congestion. He has not been able to sleep as the cough keeps him up at night when he lies down. He also feels like he has been swelling all over and has been complaining of chest pain, even on minimal exertion. The patient also says that he has not been able to keep anything down, liquids or solids, for the past few days. He noticed that his urine was getting dark in color and he was not stooling much and therefore came to the ER for further evaluation. In the Emergency Room, the patient was noted to be slightly hypoxic and also noted to have a significantly elevated BNP and clinical symptoms consistent with congestive heart failure. The patient did say that he stopped taking his Lasix about a couple of days ago along with his beta blockers.   The patient describes his chest pain as mostly exertional in nature, nonradiating. Not associated with any nausea, but does have vomiting. No fevers, no chills. Does admit to a cough which is productive with some clear and yellow sputum production.   REVIEW OF SYSTEMS:  CONSTITUTIONAL:  No documented fever. Positive weight gain, cannot recall how much. No weight loss. EYES: No blurry or double vision. ENT: No tinnitus. No postnasal drip. No redness of the oropharynx. RESPIRATORY: Positive cough. No wheeze. No hemoptysis. Positive dyspnea on exertion. CARDIOVASCULAR: Positive chest pain. Positive orthopnea. No palpitations, no syncope. GI: Positive nausea. Positive vomiting. No  diarrhea, no abdominal pain, no melena or hematochezia. GU: No dysuria or hematuria.  ENDOCRINE: No polyuria or nocturia. No heat or cold intolerance. HEME: No anemia, no bruising, no bleeding. INTEGUMENT: No rashes, no lesions. MUSCULOSKELETAL: No arthritis, no swelling, and no gout. NEUROLOGIC: No numbness or tingling. No ataxia. No seizure-type activity. PSYCH: No anxiety, no insomnia, no ADD.   PAST MEDICAL HISTORY:  1. 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  2. Hypertension.  3. Asthma.  4. Hyperlipidemia.  5. History of congestive heart failure.   SOCIAL HISTORY: He quit smoking about 18 years ago. No alcohol abuse. No illicit drug abuse. Lives at home by himself.   FAMILY HISTORY: Father died from complications of heart disease. Mother has heart problems and also diabetes.   CURRENT MEDICATIONS:  1. Centrum multivitamin daily.  2. Coreg 6.25 mg b.i.d.  3. Digoxin 125 mcg daily.  4. Ecotrin 81 mg daily.  5. Lasix 40 mg daily.  6. Lisinopril 10 mg daily.  7. Sublingual nitroglycerin as needed.  8. Potassium 20 mEq daily.  9. Ventolin inhaler 2 puffs every four hours as needed.  10. Qvar 40 mcg, 2 puffs b.i.d.   PHYSICAL EXAMINATION ON ADMISSION:  VITAL SIGNS: Temperature 97.2, pulse 100, respirations 18, blood pressure 136/80, and sats 98% on room air.   GENERAL: He is a pleasant-appearing male in mild respiratory distress.   HEENT: Atraumatic, normocephalic. His extraocular muscles are intact. His pupils are equal and  reactive to light. Sclerae anicteric. No conjunctival injection. No oropharyngeal erythema.   NECK: Supple. No jugular venous distention. No bruits, no lymphadenopathy.    HEART: Regular rate and rhythm. No murmurs, rubs, or clicks.   LUNGS: Clear to auscultation bilaterally. No rales, no rhonchi, no wheezes.   ABDOMEN: Soft, flat, nontender, nondistended. Has good bowel sounds. No  hepatosplenomegaly appreciated.   EXTREMITIES: He does have +1 to 2 pitting edema from the knees to the ankles bilaterally. +2 pedal and radial pulses.   NEUROLOGICAL: He is alert, awake, and oriented times three with no focal motor or sensory deficits appreciated bilaterally.   SKIN: Moist and warm with no rash appreciated.   LYMPHATIC: There is no cervical or axillary lymphadenopathy.   LABORATORY, DIAGNOSTIC, AND RADIOLOGICAL DATA: Serum glucose 137, BUN 27, creatinine 1.4, sodium 135, potassium 4.4, chloride 98, bicarbonate 26. LFTs: AST 201, ALT 123, alkaline phosphatase 64, total bilirubin 3.  CK 139, CK-MB 1.8. Troponin currently pending. Digoxin level is 0.9, white cell count 9, hemoglobin 13.7, hematocrit 42.5, platelet count 190. Urinalysis is within normal limits.   The patient did have a chest x-ray done which showed no acute cardiopulmonary disease. Stable cardiomegaly.   ASSESSMENT AND PLAN: This is a 54 year old male with past medical history of nonischemic cardiomyopathy, ejection fraction of 15% status post automatic implantable cardiac defibrillator, hypertension, obesity, and history of congestive heart failure who came to the hospital with shortness of breath, chest pain, and poor p.o. intake.  1. Congestive heart failure:  This is likely acute on chronic systolic dysfunction due to patient's noncompliance. The patient does appear volume overloaded with an elevated BNP. I will go ahead and diurese the patient with IV Lasix, follow ins and outs and daily weights. Continue beta blocker, continue ACE inhibitor. Follow his renal function closely as he does have chronic kidney disease.  2. Acute on chronic renal failure: We will follow her renal function as the patient is going to be on diuretics. Follow BUN and creatinine and urine output. Renal dose medications. Avoid nephrotoxins. Continue ACE inhibitor for now.  3. Hypertension: Continue Coreg and ACE inhibitor.  He is presently  hemodynamically stable.  4. Chest pain:  This is likely atypical pain. He has a nonischemic cardiomyopathy although I will keep him on telemetry. Check serial cardiac markers. We will continue nitroglycerin as needed. Continue aspirin, beta blocker, and ACE for now.  5. Acute bronchitis: We will give him a Z-Pak.  6. Diarrhea: I will keep an eye on this. Continue supportive care with some Imodium. We will check stool for comprehensive culture and C. difficile.   CODE STATUS: The patient is a FULL CODE.    TIME SPENT ON ADMISSION: 50 minutes.     ____________________________ Rolly PancakeVivek J. Cherlynn KaiserSainani, MD vjs:bjt D: 10/08/2011 18:31:49 ET T: 10/09/2011 08:22:05 ET JOB#: [id number]  cc: Rolly PancakeVivek J. Cherlynn KaiserSainani, MD, <Dictator> [place] SirenVIVEK J SAINANI MD ELECTRONICALLY SIGNED 10/14/2011 10:46

## 2014-11-10 NOTE — Consult Note (Signed)
Brief Consult Note: Diagnosis: Hemoptysis.   Patient was seen by consultant.   Consult note dictated.   Comments: Hemoptysis in the setting of sever CHF and renal failure with volume overload.  I agree with the transfer [facility] where more resources may be available for management if he should require an open lung biopsy.  Electronic Signatures: Jasmine Decemberaks, Anni Hocevar E (MD)  (Signed 08-Apr-13 17:04)  Authored: Brief Consult Note   Last Updated: 08-Apr-13 17:04 by Jasmine Decemberaks, [place] E (MD)

## 2014-11-10 NOTE — Consult Note (Signed)
PATIENT NAME:  [name], Theodoro MR#:  [id number] DATE OF BIRTH:  Jul 23, 1960  DATE OF CONSULTATION:  07/06/2011  REFERRING PHYSICIAN:  Alford Highlandichard Wieting, MD   CONSULTING PHYSICIAN:  Marcina MillardAlexander Kamal Jurgens, MD  CHIEF COMPLAINT: Chest discomfort.  HISTORY OF PRESENT ILLNESS: The patient is a 54 year old gentleman referred for evaluation of chest pain and borderline elevated troponin. The patient has a known history of nonischemic dilated cardiomyopathy with LVEF of 15%. He apparently has been living in [place] and was seen by a local physician for chest pain. Apparently, the patient had blood work which revealed an elevated troponin; and the patient was called today and was told to come to [facility] Emergency Room. Today, the patient is not experiencing any chest pain. Admission labs were notable for an elevated troponin of 0.41.   PAST MEDICAL HISTORY:  1. Nonischemic dilated cardiomyopathy.  2. History of congestive heart failure.  3. Reactive airway disease.   MEDICATIONS ON ADMISSION:  1. Ecotrin 81 mg daily.  2. Coreg 6.25 mg every 12 hours.  3. Altace 5 mg daily.  4. Lasix 20 mg daily.  5. Potassium 10 mEq daily.  6. Digoxin 0.125 mg daily.  7. Primatene Mist.   SOCIAL HISTORY: The patient is single. He quit tobacco abuse.   FAMILY HISTORY: No immediate family history of coronary artery disease or myocardial infarction.    REVIEW OF SYSTEMS: CONSTITUTIONAL: No fever or chills. EYES: No blurry vision. EARS: No hearing loss. RESPIRATORY: The patient does have chronic exertional dyspnea. CARDIOVASCULAR: The patient has chest discomfort. GI: The patient denies nausea, vomiting, diarrhea, or constipation. GU: The patient denies dysuria or hematuria. MUSCULOSKELETAL: The patient denies arthralgias or myalgias. NEUROLOGICAL: The patient denies focal muscle weakness or numbness. PSYCHOLOGICAL: The patient denies anxiety, depression.   PHYSICAL EXAMINATION:  HEENT: Pupils are equal and reactive to  light and accommodation.   NECK: Supple without thyromegaly.   LUNGS: Clear.   CARDIOVASCULAR: Normal jugular venous pressure. Diffuse point of maximal impulse. Regular rate and rhythm. Normal S1, S2. No appreciable gallop, murmur, or rub.   ABDOMEN: Soft and nontender. Pulses were intact bilaterally.   MUSCULOSKELETAL: Normal muscle tone.   NEUROLOGIC: The patient is alert and oriented x3. Motor and sensory are both grossly intact.   IMPRESSION: The patient  is 54 year old gentleman with known history of nonischemic dilated cardiomyopathy with normal coronary anatomy by prior cardiac catheterization in 2009, who presents with chest pain with atypical features with borderline elevated troponin.   RECOMMENDATIONS:  1. I agree with current therapy.  2. Continue heparin drip for now.  3. In light of negative cardiac catheterization in 2009, I would recommend Lexiscan sestamibi study in the a.m.  4. Further recommendations pending results of Lexiscan sestamibi study.  ____________________________ Marcina MillardAlexander Kaliya Shreiner, MD ap:cbb D: 07/06/2011 16:51:32 ET T: 07/06/2011 17:15:29 ET JOB#: [id number]  cc: Marcina MillardAlexander Oyinkansola Truax, MD, <Dictator> Marcina MillardALEXANDER Ronan Dion MD ELECTRONICALLY SIGNED 07/31/2011 10:03

## 2014-11-10 NOTE — Consult Note (Signed)
Patient unavailable x2 for evaluation today.  Patient had a normal platelet count upon admission.  There is no evidence of splenomegaly. His decreasing platelet count is possibly secondary to his underlying infection.  Also could be medication induced.  HITT antibodies have been ordered.  Will order DIC panel today for completeness.  He does not require bone marrow biopsy at this time. consult to follow tomorrow.  Electronic Signatures: Delight Hoh (MD)  (Signed on 07-Apr-13 11:10)  Authored  Last Updated: 07-Apr-13 11:10 by Delight Hoh (MD)
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
## 2014-11-10 NOTE — Consult Note (Signed)
PATIENT NAME:  Ronald Oliver, Ronald Oliver MR#:  [id number] DATE OF BIRTH:  09-05-1960  DATE OF CONSULTATION:  10/21/2011  REFERRING PHYSICIAN:  Ned Clines, MD CONSULTING PHYSICIAN:  Rosalyn Gess. Sahira Cataldi, MD  REASON FOR CONSULTATION: Worsening pulmonary infiltrates and acute renal failure.   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 He was found to have a creatinine on admission of 1.49 and transaminitis. His white count on admission was 9.9. After admission he began having hemoptysis. His creatinine has been slowly increasing and his white count has risen as well, up to 14.0 at last check, on 10/16/2011. A rapid influenza test on admission was negative and blood cultures from 10/16/2011 are negative and a stool culture was negative. No other cultures have been obtained. Urinalysis was fairly unremarkable. His respiratory status has worsened recently and he has had increasing hemoptysis. He was to go for a bronchoscopy today, but was found to be tachypneic, hypotensive, and significantly short of breath. He was transferred to the Critical Care Unit. Since admission he has been on the following antibiotics. He was initially placed on azithromycin and received a five-day course. He was then off antibiotics until 10/16/2011 when he was started on azithromycin and ceftriaxone. On 10/20/2011, vancomycin was added to the azithromycin. The ceftriaxone was discontinued on 10/19/2011. His antibiotics were changed today to ertapenem and vancomycin. The bronchoscopy scheduled for today was canceled due to his worsening clinical condition. His CT scan has shown worsening infiltrates.   ALLERGIES: No known drug allergies.   PAST MEDICAL HISTORY:  1. Ischemic  cardiomyopathy with an ejection fraction of 15%, status post automatic implantable cardiac defibrillator.  2. Hypertension.  3. Asthma.  4. Hypercholesterolemia.  5. Congestive heart failure.   SOCIAL HISTORY: He lives by himself. He has a dog at home. He is a prior smoker having quit almost two decades ago. He does not drink. He has no history of injecting drug use. He has not traveled anywhere outside of [place] other than to [place] recently.   FAMILY HISTORY: Positive for coronary artery disease, diabetes, and congestive heart failure.   REVIEW OF SYSTEMS: GENERAL: Some fever. No chills. No sweats. Generalized malaise. Generalized weakness. HEENT: No headaches. No sinus congestion. No sore throat. NECK: No stiffness. No swollen glands. RESPIRATORY: Positive cough with hemoptysis. Positive shortness of breath that is worsening. CARDIAC: Positive chest pains worse in the posterior right aspect of the chest. Positive peripheral edema in all four extremities. No palpitations. GI: Positive nausea and vomiting. He states that his vomiting is worse after meals. He has had decreased appetite and can only tolerate small amounts. He has had no change in his bowels. GU: He has had decreased urination but no dysuria. No hematuria. MUSCULOSKELETAL: Generalized weakness and achiness, but no focal joint or muscle complaints. SKIN: He noticed a rash on the right posterior aspect of his chest, but no other lesions. He has had no episodes of spontaneous bleeding. NEURO: Generalized weakness but no focal complaints of weakness. No confusion. PSYCHIATRIC: No complaints. All other systems are negative.   PHYSICAL EXAMINATION:   VITAL SIGNS: T-max 98.5, T-current 97.4, pulse 96, blood pressure 114/22, respiratory rate  50, and pulse oximetry 100% on 3 liters.   GENERAL: A 54 year old white man, uncomfortable, and  in moderate respiratory distress.   HEENT: Normocephalic, atraumatic. Pupils equal and  reactive to light. Extraocular motion intact. Sclerae, conjunctivae, and lids are without evidence for emboli or petechiae. There was slight yellowing to the sclerae. Oropharynx shows no erythema or exudate. Teeth and gums are in fair condition.   NECK: Supple. Full range of motion. Midline trachea. No lymphadenopathy. No thyromegaly.   LUNGS: Bilateral crackles throughout. No focal consolidation. He had some difficulty speaking in full sentences due to his tachypnea.   HEART: Regular rate and rhythm without murmur, rub, or gallop.   ABDOMEN: Soft, nontender, and nondistended. No hepatosplenomegaly. No hernia is noted.   EXTREMITIES: No evidence for tenosynovitis. He had diffuse edema in all four extremities however that was 3+ pitting.  SKIN: No rashes. No stigmata of endocarditis, specifically no Janeway lesions or Osler nodes.   NEUROLOGIC: The patient was awake and interactive, moving all four extremities. He was  able to provide a fairly good history, although he was somewhat limited, more by his shortness of breath than anything else.   PSYCHIATRIC: Mood and affect appeared normal.  LABS/STUDIES: BUN 63, creatinine 2.06, potassium 5.7, bicarbonate 13, and anion gap 17. AST from 10/09/2011 is 334 with an ALT of 206, alkaline phosphatase 55, total bilirubin 2.4, protein 6.2, and albumin 3.1. White count from 10/16/2011 is 14.0 with a hemoglobin of 13.6, platelet count 142, and ANC 9.3. White count on admission was 9.9. No further CBCs have been obtained. Rapid influenza test was negative on admission.   Stool culture from 10/13/2011 showed no pathogens.  Blood cultures from 10/16/2011 are negative. No further cultures have been obtained.   Urinalysis had 30 mg/dL of protein, otherwise was unremarkable.   Antiglomerular basement membrane antibodies were negative. ANCA was negative. An ANA was positive with an anti-RMP elevated at 4.0. SPEP was unremarkable with no M spike noted. A UPEP  had increased total urine protein, but again no M spike was noted. A rapid HIV test was negative.   ABG from today had a pH of 7.37, pCO2 of less than 19, and pO2 of 124 without a percent saturation, on 28% oxygen.  A chest x-ray from admission showed no acute cardiopulmonary disease.   Bilateral renal ultrasound showed hyperechoic masses associated with renal cortical margins bilaterally. No hydronephrosis was noted.   CT scan of the abdomen and pelvis without contrast showed no renal mass.   CT scan of the chest without contrast demonstrated a new large right lower lobe infiltrate consistent with pneumonia. There was mediastinal lymphadenopathy and cardiomegaly was present.   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   CT scan of the chest without contrast from yesterday, 10/20/2011, showed increased infiltrate in the base of the right lower lobe and atelectasis versus infiltrate in the base of the left lower lobe.   Chest x-ray from today showed findings suspicious for progressive atelectasis or pneumonia, in the right lower lobe.   IMPRESSION: A 54 year old white man with a history of congestive heart failure who was admitted with pulmonary infiltrates, hemoptysis, and acute renal failure with worsening hypoxia.   RECOMMENDATIONS:  1. He has been afebrile but had some leukocytosis. It has not been checked in several days, however. His imaging studies have shown worsening infiltrates. This could be pneumonia, but pulmonary renal syndrome or worsening pulmonary edema due to his congestive heart failure and renal failure are  also possible. 2. We will broaden his antibiotics to vancomycin and meropenem to cover Pseudomonas, resistant gram-positive cocci, and anaerobes. This is unlikely to be an atypical organism and he has received atypical coverage. One exception would be Legionella. We will  send a Legionella antigen. We will send fungal serologies as well.  3. He is to start hemodialysis today. Hemodialysis catheter is in place.  4. We will get a CBC and CMP in the morning.  5. We will try to obtain a sputum culture.  6. Once stabilized, I suspect he will need either bronchoscopy or an open lung biopsy to determine the etiology unless sputum cultures are revealing.  7. Would consider asking rheumatology to see him to comment on the positive ANA. We will send a sedimentation rate tomorrow as well.         Thank you very much for involving me in Ronald Oliver's care.  TIME SPENT: Approximately 35 minutes was spent providing critical care to this patient.  ____________________________ Rosalyn GessMichael E. Raidon Swanner, MD meb:slb D: 10/21/2011 15:50:31 ET T: 10/21/2011 17:18:27 ET JOB#: [id number]  cc: Rosalyn GessMichael E. Liliahna Cudd, MD, <Dictator> Dagen Beevers E Alaynna Kerwood MD ELECTRONICALLY SIGNED 10/25/2011 12:37

## 2014-11-10 NOTE — Consult Note (Signed)
Impression: 54yo WM w/ h/o CHF admitted with pulmonary infiltrates, hemoptysis, acute renal failure with worsening hypoxia.  He has been afebrile, but has some leukocytosis.  His imaging studies have shown worsening infiltrates.  This could be pneumonia, but pulmonary renal syndrome or worsening pulmonary edema due to his CHF and renal failure are also possible. Will broaden his antibiotics to vanco and meropenem to cover Pseudomonas, resistant GPC and anaerobes.  This is unlikely to be an atypical organism and he has received atypical coverage.  One exception would be Legionella.  Will send a Legionella Ag.  Will send fungal serologies as well. He is to start HD today.  HD catheter has been placed. Will get CBC and CMP in am.   Will try to obtain sputum culture. Once stable, I expect that we will need either a bronchoscopy or open lung biopsy to determine the etiology, unless sputum cultures are revealing.  Electronic Signatures: Jomes Giraldo, Rosalyn GessMichael E (MD)  (Signed on 04-Apr-13 15:35)  Authored  Last Updated: 04-Apr-13 15:35 by Leyan Branden, Rosalyn GessMichael E (MD)

## 2014-11-10 NOTE — Consult Note (Signed)
Patient with ARF, cardiomyopathy, multiple other issues.  Needs HD at this time, and will place temp cath.    Electronic Signatures: Annice Needyew, Phelan Goers S (MD)  (Signed on 04-Apr-13 15:02)  Authored  Last Updated: 04-Apr-13 15:02 by Annice Needyew, Ayeshia Coppin S (MD)

## 2014-11-10 NOTE — Op Note (Signed)
PATIENT NAME:  Ronald Oliver, Ronald Oliver MR#:  [id number] DATE OF BIRTH:  1960/12/03  DATE OF PROCEDURE:  10/21/2011  PREOPERATIVE DIAGNOSES:  1. Acute renal failure.  2. Chest pain.  3. Severe cardiomyopathy.   POSTOPERATIVE DIAGNOSES:  1. Acute renal failure.  2. Chest pain.  3. Severe cardiomyopathy.   PROCEDURE:  1. Ultrasound guidance for vascular access, right femoral vein.  2. Placement of right femoral Duo Glide dialysis catheter 30 cm in length.   SURGEON: Annice NeedyJason S. Judith Campillo, M.D.   ANESTHESIA: Local with 1% Xylocaine.   ESTIMATED BLOOD LOSS: 25 mL.   INDICATION FOR PROCEDURE: This is a 54 year old white male who has known severe cardiomyopathy. He is admitted with multiple other ongoing issues and has acute renal failure. Hemodialysis is to be started tonight by the nephrology service and we are asked to place a dialysis catheter. Risks and benefits were discussed. Informed consent was obtained.   DESCRIPTION OF PROCEDURE: The patient was laid flat in his critical care bed. His right groin was sterilely prepped and draped and a sterile surgical field was created. The right femoral vein was visualized with ultrasound and found to be patent. It was then accessed under direct ultrasound guidance without difficulty with a Seldinger needle. A J-wire was placed. After skin nick dilatation, the Duo Glide dialysis catheter, 30 cm in length, was placed over the wire and the wire was removed. It was secured with 3 nylon sutures. Both lumens withdrew dark red nonpulsatile blood and flushed easily with sterile saline.    A sterile dressing was placed. The patient tolerated the procedure well.   ____________________________ Annice NeedyJason S. Jada Kuhnert, MD jsd:bjt D: 10/21/2011 15:54:22 ET T: 10/21/2011 17:32:26 ET JOB#: [id number]  cc: Annice NeedyJason S. Lydell Moga, MD, <Dictator> Annice NeedyJASON S Destine Zirkle MD ELECTRONICALLY SIGNED 10/22/2011 8:13

## 2014-11-10 NOTE — Discharge Summary (Signed)
PATIENT NAME:  Ronald Oliver, Ronald Oliver MR#:  [id number] DATE OF BIRTH:  12/12/1960  DATE OF ADMISSION:  10/08/2011 DATE OF DISCHARGE:  10/25/2011 if bed is available  [facility]  ACCEPTING PHYSICIAN: Dr. Ramond Dialonohue pulmonary physician [facility].   Please see dictated interim discharge summary by Dr. Nemiah CommanderKalisetti on 10/24/2011 for further details and also history and physical dictated by Dr. Cherlynn KaiserSainani on 10/08/2011.   CONSULTATION: After the initial interim discharge summary by Dr. Nemiah CommanderKalisetti Dr. Hulda Marinimothy Oaks from surgery had evaluated the patient and recommended transfer [facility] for further management and possibly open lung biopsy.    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   LABORATORY, DIAGNOSTIC AND RADIOLOGICAL DATA: EEG was performed on 10/25/2011 and showed mild abnormality and was not showing any obvious epileptiform activity.   Chest x-ray on 04/08 showed right lower lobe density. Associated pneumonia cannot be excluded. V/Q scan on 10/25/2011 showed no pulmonary embolus. Right lower lobe activity, possibly consolidation.    HISTORY AND SHORT HOSPITAL COURSE: Patient is a 54 year old male with above-mentioned medical problems was admitted for poor p.o. intake, chest pain and shortness of breath. Please see Dr. Hilbert OdorSainani's dictated history and physical on 10/08/2011. Also see Dr. Prudencio PairKalisetti's dictated interim summary on 10/24/2011 for further details. 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 Meredeth IdeFleming from pulmonary and Dr. Hulda Marinimothy Oaks from cardiothoracic surgery recommended transfer to tertiary care center and [facility]. Accepting physician is Dr. Ramond Dialonohue (pulmonary physician) and once a bed is available  patient will be transferred there for further treatment including possible plasmapheresis.   TOTAL TIME TAKING AT THIS PATIENT: 40 minutes.   ____________________________ Ellamae SiaVipul S. Sherryll BurgerShah, MD vss:cms D: 10/25/2011 17:22:22 ET T: 10/25/2011 17:53:09 ET JOB#: [id number]  cc: Mosiah Bastin S. Sherryll BurgerShah, MD, <Dictator> Dr. Ramond Dialonohue, [facility]monary [facility]  Ellamae SiaVIPUL S [facility] MD ELECTRONICALLY SIGNED 10/26/2011 9:07

## 2014-11-10 NOTE — Consult Note (Signed)
PATIENT NAME:  Ronald Oliver, CARTLIDGE MR#:  [id number] DATE OF BIRTH:  [birth year]  DATE OF CONSULTATION:  10/10/2011  REFERRING PHYSICIAN:  Hilda Lias, MD CONSULTING PHYSICIAN:  Mayvis Agudelo D. Tyquarius Paglia, MD  INDICATION: Chest pain and shortness of breath.   HISTORY OF PRESENT ILLNESS: Mr. Ebron is a 54 year old male who came to the emergency room not feeling well over the last 10 days or so. He developed some cough and congestion. He has not been able to sleep because of a cough, when he tries to lie down. He has had swelling all over. He complains of chest pain, even more on exertion, and has not been able to keep anything down, liquids or solids, over the last few days. His urine was getting dark. In the emergency room, he was noted to be slightly hypoxic and had an elevated BNP. He also appeared to have evidence of congestive heart failure.  He had stopped his Lasix a couple of days ago as well as his beta blocker, which may have caused worsening symptoms, so he came to the emergency room for evaluation.   REVIEW OF SYSTEMS: Denies blackout spells or syncope. Denies nausea or vomiting. Denies fever, chills, or sweats. No weight loss or weight gain. No hemoptysis or hematemesis. Denies bright red blood per rectum. No vision change or hearing change. Denies sputum production. He has had cough.   PAST MEDICAL HISTORY:  1. Nonischemic cardiomyopathy. 2. Hypertension. 3. Asthma.  4. Hyperlipidemia.  5. Congestive heart failure. 6. Mild obesity.   FAMILY HISTORY: Heart disease and diabetes.   SOCIAL HISTORY: He quit smoking 18 years ago. Currently unemployed. No alcohol consumption.   MEDICATIONS:  1. Centrum multivitamin daily.  2. Coreg 6.25 mg twice a day.  3. Digoxin 0.125 mg a day.  4. Ecotrin 81 mg a day.  5. Lasix 40 mg a day.  6. Lisinopril 10 mg a day.  7. Sublingual nitroglycerin p.r.n.  8. Potassium 20 mEq a day.  9. Ventolin inhaler 2 puffs every 4 hours p.r.n.  10. Q-Var 40 mcg two  puffs twice a day.   PHYSICAL EXAMINATION:   VITAL SIGNS: Blood pressure 140/80, pulse 100, respiratory rate 18, afebrile.   HEENT: Normocephalic, atraumatic. Pupils are equal and reactive to light.   NECK: Supple. No significant jugular venous distention, bruits, or adenopathy.   LUNGS: Bilateral rhonchi. Faint rales in the bases. No wheezing. Adequate air movement.   HEART: Regular rate and rhythm. Systolic ejection murmur at the apex. Positive S3. Soft S4.   ABDOMEN: Benign. Positive bowel sounds. No rebound, guarding, or tenderness.   EXTREMITIES: Examination is within normal limits with 1 to 2+ edema.   NEUROLOGIC: Intact.   SKIN: Normal.   LABS/STUDIES: Glucose 137, BUN 27, creatinine 1.4, sodium 135, potassium 4.4. LFTs elevated; AST 201, ALT 123, alkaline phosphatase 64, and total bilirubin 3. CK was normal. Digoxin 0.9. White count 9, hemoglobin 13.7, hematocrit 42, and platelet count 190.   Urinalysis was normal.   Chest x-ray shows cardiomegaly and mild evidence of failure.    ASSESSMENT:  1. Congestive heart failure.  2. Cardiomyopathy.  3. Hypertension.  4. Renal insufficiency. 5. Chest pain. 6. Bronchitis. 7. Gastroenteritis with diarrhea. 8. Mild obesity.  9. Hyperlipidemia.   PLAN: Agree with admit. Rule out for myocardial infarction. Place on telemetry. Follow cardiac enzymes. Follow-up EKG. Recommend repeat echocardiogram. Recommend Lasix and oxygen. Reintroduce ACE inhibitor therapy with Coreg. Digoxin also should be restarted. We will discuss the case with  Dr. Lady GaryFath. He has seen him in the past. Part of his problem is noncompliance right now, but recommend further evaluation as necessary for his symptoms. Follow up renal insufficiency. Base further evaluation on his progress.  ____________________________ Bobbie Stackwayne D. Juliann Paresallwood, MD ddc:slb D: 10/11/2011 09:01:00 ET T: 10/11/2011 09:20:58 ET JOB#: [id number]  cc: Jazmynn Pho D. Juliann Paresallwood, MD, <Dictator> Alwyn PeaWAYNE D  Josephyne Tarter MD ELECTRONICALLY SIGNED 11/12/2011 15:50

## 2014-11-10 NOTE — Consult Note (Signed)
Referring Physician:  Henreitta Leber   Primary Care Physician:  Nonlocal MD, MD :   Reason for Consult:  Admit Date: 08-Oct-2011   Chief Complaint: chest pain, shortness of breath   Reason for Consult: altered mental status   History of Present Illness:  History of Present Illness:   Mr. Nahiem Dredge is seen in consultation at the request of Dr. Verdell Carmine for evaluation of episodes of transient altered mental status. Kimble is a 54 year-old make with a past medical history significant for nonischemic cardiomyopathy with ejection fraction of 15%, status post automatic implantable cardiac defibrillator, hypertension, asthma, CHF, and hyperlipidemia who was admitted on 10/08/2011 for shortness of breath, chest pain, and acute renal failure. 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 The patient had an episode last night, witnessed by his nurse, in which he transiently lost conciousness (the witnessing nurse was not available to provide any history, but the covering nurse for today obtained some history from the witnessing nurse and was able to rpovide this history for me). Per report, the patient's eyes rolled in the back of his head, and he became unresponsive. It's unclear how long the episode lasted for, though it appears to have been only a short amount of time. No convulsions were noted during this episode. Per the patient, he had been experiencing some severe back pain prior to losing conciousness. He then lost conciousness without any warning, and when he woke back up, he noted that the nurse was "pounding on my chest." He initially felt a little bit confused, but quickly was able to re-orient himself. The patient reports that these episodes happen frequently, though last night's episode was the worst one he has experienced. He notes that about 9 months  ago, he began to have episodes in which he would feel lightheaded and wobbly. Knowing that he was about to lose conciousness, he would try to sit down and relax; sometimes, the symptoms would pass without incident, while other times, he sould lose consiousness for about 1-2 seconds, then would quickly regain conciousness. The patient denies ever having any tongue-biting or bowel/bladder incontinence associated with these episodes. He would feel weak after a typical episode, but denies any muscle soreness. The episodes sometimes occur with shifts in position (e.g. going from sitting to standing position), but sometimes occurs even when he has been in the same position for a while. No symptoms of chest pain or palpitations associated with these episodes. He notes that these episodes occur about three times per week, and they have been becoming more frequent over time.   ROS:   General fatigue    HEENT no complaints    Lungs SOB    Cardiac no complaints    GI constipation    GU no complaints    Musculoskeletal back pain    Extremities swelling    Skin no complaints    Neuro no complaints    Endocrine no complaints    Psych no complaints   Past Medical/Surgical Hx:  Other, see comments: defibrilator  CHF:   Pneumothorax L:   Asthma:   Past Medical/ Surgical Hx:   Past Medical History Nonischemic cardiomyopathy with ejection fraction of 15%, status post automatic implantable cardiac defibrillator.  Hypertension.  Asthma.  Hyperlipidemia.  History of congestive heart failure.   Home Medications: Medication Instructions Last Modified Date/Time  digoxin 125 mcg (0.125 mg)  oral tablet take 1 tablet by mouth daily 22-Mar-13 22:56  potassium chloride 10 mEq oral tablet, extended release take 1 tablet by mouth daily 22-Mar-13 22:56  Coreg 6.25 mg oral tablet take 1 tablet by mouth every 12 hours 22-Mar-13 22:56  Ecotrin Adult Low Strength 81 mg oral enteric coated tablet take 1  tablet by mouth daily 22-Mar-13 22:56  lisinopril 10 mg oral tablet 1 tab(s) orally once a day 22-Mar-13 22:56  Lasix 40 mg oral tablet 1 tab(s) orally once a day 22-Mar-13 22:56  Nitrostat 0.4 mg sublingual tablet 1 tab(s) sublingual every 5 minutes as needed for chest pain * *no more than 3 in 51mns** 22-Mar-13 22:56  Centrum Flavor Burst oral tablet, chewable 1 tab(s) orally once a day 22-Mar-13 22:56  Qvar 40 mcg/inh inhalation aerosol 2 puff(s) inhaled 2 times a day 22-Mar-13 22:56  Ventolin HFA 2 puff(s) inhaled every 4 hours, As Needed 22-Mar-13 22:56   [facility] Current Meds:  Acetaminophen * tablet, ( Tylenol (325 mg) tablet)  650 mg Oral q4h PRN for pain or temp. greater than 100.4  - Indication: Pain/Fever  Ondansetron injection, ( Zofran injection )  4 mg, IV push, q4h PRN for Nausea/Vomiting  Indication: Nausea/ Vomiting  Carvedilol tablet,  ( Coreg)  6.25 mg Oral bid  - Indication: CHF/ Hypertension  Instructions:  Hold for SBP <100  Nitroglycerin tablet,  ( Nitrostat SL)  0.4 mg Sublingual Q5M PRN for chest pain  - Indication: Angina/ Hypertension  Instructions:  q 5 minutes x 3 doses PRN  Influenza Virus Trivalent Vaccine injection, 0.5 ml, Intramuscular, atdischarge  Indication: Provide Active Immunity to Influenza Strains contained in Vaccine, GIVE ON DISCHARGE DAY.  Cetirizine HCl tablet,  ( ZyrTEC)  10 mg Oral daily  - Indication: Rhinitis/ Allergy Symptoms  Magnesium Oxide tablet, ( Mag-Ox)  400 mg Oral daily  - Indication: Prevention of Magnesium Deficiency  Amiodarone tablet, ( Cordarone)  200 mg Oral bid  - Indication: Tachycardia/ Atrial Fibrillation  Ranitidine tablet,  ( ZanTAC)  150 mg Oral q12h  - Indication: Hyperacidity  Albuterol Oral inhaler, 2 puff(s) Inhalation q4h PRN for shortness of breath with Spacer (Op  - Indication: Bronchodilator  Instructions:  [Waste Code: SendToRx]  OptiChamber, 1 each Inhalation ud PRN for use with oral  inhaler  Tramadol tablet, ( Ultram)  50 mg Oral q6h PRN for pain  - Indication: Pain  Zolpidem tablet,  ( Ambien)  5 mg Oral at bedtime PRN for sleep  - Indication: Insomnia  Budesonide-Formoterol 160/4.5 mcg HFAA Inhaler,  ( Symbicort 160/4.5 inhaler )  2 puff(s) Inhalation bid  -Indication:asthma  Instructions:  [Waste Code: SendToRx]  Inhaler must be reprimed if it has been dropped or not used in 7 days or more.  Shake well for 5 seconds before each use.  Nystatin 100,000u/ml suspension, ( Mycostatin 100,000u/ml suspension )  5 ml Oral q6h  -Indication:Fungal Infection  Line Flush - Normal Saline, 5 ml, IV push, daily  Indication: Line patency, Flush each UNUSED port with 5 ml saline, 5 ml to each unused lumen daily  Line Flush - Normal Saline, 5 to 10 ml, IV push, ud PRN for line patency, Flush each lumen with 5 ml before and 10 ml after each med admin, TPN, blood draw or blood administration.  Line Flush Heparin 10 units/ml injection, 5 ml, IV push, ud PRN for line patency  Indication: Line Patency, Monitor Anticoags per hospital protocol, Flush lumen with 50 units after  med admin, TPN, blood draws or blood administration.  Line Flush Heparin 10 units/ml injection, 5 ml, IV push, daily  Indication: Line Patency, Monitor Anticoags per hospital protocol, Flush each UNUSED port with 50 units Heparin every 24 hours., Flush each unused lumen with 50 units every 24 hours.  Lidocaine 2% Viscous solution, ( Xylocaine 2% Viscous solution )  4 ml Oral qid  Instructions:  please give prior to meals  Furosemide injection,  ( Lasix injection )  20 mg, IV push, once  Indication: Diuresis  Sodium Polystyrene Sulf 15G/11m suspension,  ( Kayexalate 15G/680msuspension )  30 gram Oral once  methylPREDNISolone injection,  ( SoluMEDROL injection )  40 mg, IV push, q12h  Indication: Anti-inflammatory Agent/ Immunosuppressant  Meropenem Injection, 1 gram in Sodium Chloride 0.9% 100 ml, IV  Piggyback, q8h, Infuse over 30 minute(s)  Indication: Infection  Allergies:  No Known Allergies:   Social/Family History:  Social History: Lives alone. Unemployed. Denies tobacco use, alcohol use, or illicit drug use.   Family History: Notable for heart disease. No history of seizuresin family.   Vital Signs: **Vital Signs.:   07-Apr-13 04:02   Vital Signs Type Routine   Temperature Temperature (F) 97.3   Celsius 36.2   Pulse Pulse 101   Pulse source per Dinamap   Respirations Respirations 18   Systolic BP Systolic BP 98   Diastolic BP (mmHg) Diastolic BP (mmHg) 71   Mean BP 80   BP Source Dinamap   Pulse Ox % Pulse Ox % 98   Pulse Ox Activity Level  At rest   Oxygen Delivery Room Air/ 21 %    08:53   Pulse Pulse 115   Respirations Respirations 18   Systolic BP Systolic BP 94   Diastolic BP (mmHg) Diastolic BP (mmHg) 72   Mean BP 79   Pulse Ox % Pulse Ox % 95   Pulse Ox Activity Level  At rest   Oxygen Delivery Room Air/ 21 %   Physical Exam:  General: No acute distress   HEENT: Atraumatic   Neck: central line in right side of neck   Chest: Clear anteriorly   Cardiac: distant heart sounds   Extremities: 2+ edema in lower extremties   Neurologic Exam:  Mental Status: Awake and alert, oriented [facility]. Attention and concentraton within normal limits. Speech is hoarse, but fluent and without dysarthria. Naming and repetition are intact.   Cranial Nerves: Visual fields intact. PERRL. EOMI. Facial sensation intact. Face symmetrical. Hearing grossly intact. Palate elevates midline. Full shoulder shrug bilaterally. Tongue protrudes midline.   Motor Exam: Normal bulk and tone. No pronator drift with arms, though patient's entire body does drift slightly towards the right when testing pronator drift. 5/5 bilateral deltoids, biceps, triceps, hand grip. Lower extremity strength limited by pain and poor effort, grossly exhibits 3/5 iliopsoas,  4/5 foot dorsi/plantarflexion bilaterally.   Deep Tendon Reflexes: Absent throughout. Toes equivocal bilaterally.   Sensory Exam: Intact to light touch throughout.   Coordination: Finger-to-nose intact bilaterally.   Cerebellar Exam: No truncal ataxia   Gait: Deferred due to patient's complaints of pain   Lab Results: Hepatic:  07-Apr-13 08:34    Albumin, Serum   2.7   Bilirubin, Total   4.9   Alkaline Phosphatase 114   SGPT (ALT)   567   SGOT (AST)   254   Total Protein, Serum   6.2  TDMs:  22-Mar-13 12:43  Digoxin, Serum 0.96  Routine Chem:  22-Mar-13 12:43    B-Type Natriuretic Peptide [facility])   [phone]  24-Mar-13 05:40    Magnesium, Serum 2.2  07-Apr-13 08:34    Glucose, Serum   130   BUN   54   Creatinine (comp)   1.92   Sodium, Serum   129   Potassium, Serum   5.9   Chloride, Serum   92   CO2, Serum 26   Calcium (Total), Serum   8.4   Phosphorus, Serum 4.5   Anion Gap 11   Osmolality (calc) 275   eGFR (African American)   48   eGFR (Non-African American)   40  Misc Urine Chem:  28-Mar-13 02:27    Creatinine, Urine   251.7   Protein, Random Urine   53   Protein/Creat Ratio (comp)   211  Cardiac:  24-Mar-13 05:40    Troponin I < 0.02   CK, Total 106   CPK-MB, Serum 1.1  Routine UA:  22-Mar-13 15:59    Color (UA) Amber   Clarity (UA) Hazy   Glucose (UA) Negative   Bilirubin (UA) Negative   Ketones (UA) Negative   Specific Gravity (UA) 1.021   Blood (UA) Negative   pH (UA) 5.0   Protein (UA) 30 mg/dL   Nitrite (UA) Negative   Leukocyte Esterase (UA) Negative   RBC (UA) <1 /HPF   WBC (UA) 1 /HPF   Bacteria (UA) TRACE   Mucous (UA) PRESENT   Hyaline Cast (UA) 26 /LPF  Routine Hem:  05-Apr-13 04:04    Erythrocyte Sed Rate 1  07-Apr-13 08:34    WBC (CBC)   25.1   RBC (CBC) 4.50   Hemoglobin (CBC) 13.0   Hematocrit (CBC) 41.5   Platelet Count (CBC)   70   MCV 92   MCH 28.8   MCHC   31.3   RDW   17.8   Neutrophil % 87.9   Lymphocyte % 6.8    Monocyte % 5.1   Eosinophil % 0.1   Basophil % 0.1   Neutrophil #   22.1   Lymphocyte # 1.7   Monocyte #   1.3   Eosinophil # 0.0   Basophil # 0.0   Impression/Recommendations:  Recommendations:   54 year-old make with a past medical history significant for nonischemic cardiomyopathy with ejection fraction of 15%, status post automatic implantable cardiac defibrillator, hypertension, asthma, CHF, and hyperlipidemia who was admitted on 10/08/2011 for shortness of breath, chest pain, and acute renal failure. He has had a complicated hospital course, and last night he had an episode of transient unresponsiveness. of transient alterd mental status: The patient reports that for the past 9 months, he has had episodes in which he feels lightheaded, then often loses conciousness for several seconds before quickly regaining conciousness. These episodes have not been associated with incontinence or tongue-biting. They have been occurring more frequently, about three times per week now. Last night, he had a witnessed episode in which he had severe back pain, then his nurse witnessed his eyes rolling in the back of his head, with the patient subsequently becoming unresponsive for a short (albeit unspecified) amount of time; after he regained conciousness, he was quickly notedto be back at his baseline mental status. The differential diagnosis includes syncope (more likely, especially given his underlying cardiac issues) vs. seizure (less likely given atypical presentation).routine EEG to rule-out any focal epileptogenic spikes. Given patient's reported frequency of spells, may consider  either an ambulatory or video EEG, though this can likely be arranged as an outpatient.indication to start any anti-epileptic medication at this current time.check a set of orthostatic vital signs.on cardiac telemetry to see if there is any arrhythmia correlating with any future spells.sedating medications that can alter his mental  status (e.g. patient has zolpidem on his current medication list) unless absolutely necessary  Electronic Signatures: Rennis Chris (MD)  (Signed 07-Apr-13 12:08)  Authored: REFERRING PHYSICIAN, Primary Care Physician, Consult, History of Present Illness, Review of Systems, PAST MEDICAL/SURGICAL HISTORY, HOME MEDICATIONS, Current Medications, ALLERGIES, Social/Family History, NURSING VITAL SIGNS, Physical Exam-, LAB RESULTS, Recommendations   Last Updated: 07-Apr-13 12:08 by Rennis Chris (MD)

## 2014-11-10 NOTE — Consult Note (Signed)
Patient once again not available for for evaluation.  Thrombocytopenia: Platelets have decreased again today.  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 Anemia: Patient noted to be iron deficient and received 510 mg IV Feraheme today. follow.  Electronic Signatures: Gerarda FractionFinnegan, Timothy (MD)  (Signed on 08-Apr-13 13:43)  Authored  Last Updated: 08-Apr-13 13:43 by Gerarda FractionFinnegan, Timothy (MD)

## 2014-11-10 NOTE — Consult Note (Signed)
PATIENT NAME:  Ronald Oliver, DEBARR MR#:  [id number] DATE OF BIRTH:  07/01/1961  DATE OF CONSULTATION:  10/21/2011  REFERRING PHYSICIAN:  Dr. Thedore Mins  CONSULTING PHYSICIAN:  Annice Needy, MD  REASON FOR CONSULTATION: Dialysis catheter placement.   HISTORY OF PRESENT ILLNESS: This is a 54 year old white male who was admitted nearly two weeks ago with shortness of breath and chest pain. He has known severe cardiomyopathy with ejection fraction less than 15%. He has been complaining of chest pain throughout the day today. He has difficulty with breathing. He has developed acute renal failure and now requires hemodialysis. We are asked to place a dialysis catheter for initiation of dialysis this evening.  PAST MEDICAL HISTORY: 1. Cardiomyopathy with ejection fraction of 15%.  2. Hypertension.  3. Hyperlipidemia.  4. Congestive heart failure.  5. Automatic implantable cardiac defibrillator placement.   PAST SURGICAL HISTORY:  Automatic implantable cardiac defibrillator placement.   HOME MEDICATIONS:  1. Multivitamin daily.  2. Coreg 6.25 mg b.i.d.  3. Digoxin 125 mcg daily.  4. Ecotrin 81 mg daily.  5. Lasix 40 mg daily.  6. Lisinopril 10 mg daily.  7. Sublingual nitroglycerin as needed.  8. Potassium 20 mEq daily.  9. Ventolin inhaler 2 puffs every four hours as needed.  10. Qvar 40 mcg 2 puffs b.i.d.   ALLERGIES: None known.   SOCIAL HISTORY: He has no current alcohol, tobacco, or drug abuse but he does have previous history of multiple substance use. This has not been for many years. He lives at home by himself.   FAMILY HISTORY: Father died from heart disease. Mother is alive, has heart problems and diabetes.   REVIEW OF SYSTEMS: GENERAL: Positive for weight gain. No fever or chills. EYES: No blurred or double vision. EARS: No tinnitus or ear pain. CARDIAC: Positive for some chest pain. RESPIRATORY: Positive for dyspnea on exertion and feeling short-winded now even at rest.  GI: Positive  for nausea and vomiting on admission, which is still not improved. GU: No dysuria or hematuria.  He does have  acute renal failure. ENDOCRINE: No heat or cold intolerance. HEME: No anemia or easy bruising. SKIN: No rashes or ulcers. MUSCULOSKELETAL: Positive for rib and chest pain, but no extremity problems at this time.  NEUROLOGIC: No transient ischemic attack, stroke, or seizure. PSYCH: No anxiety or depression.   PHYSICAL EXAMINATION:  GENERAL: This is an obese, white male sitting in his critical care bed, somewhat labored respirations on nasal cannula oxygen, but not in acute distress.   VITAL SIGNS: Temperature 97.4, pulse 96, blood pressure 114/22, respirations are 24. Saturations are 100% on nasal cannula.   HEAD: Normocephalic and atraumatic.   EYES: Sclerae anicteric. Conjunctivae are clear.   EARS: Normal external appearance. Hearing is intact.   NECK: Supple without adenopathy or jugular venous distention.   HEART: Somewhat tachycardic with a systolic murmur.   LUNGS: Diminished with expiratory wheezes bilaterally.   ABDOMEN: Soft, nondistended, nontender.   EXTREMITIES: Some lower extremity edema. Good capillary refill.  NEURO: Normal strength and tone in all four extremities, does follow commands.   LABORATORY, DIAGNOSTIC, AND RADIOLOGICAL DATA: Sodium 121, potassium 5.7, chloride 91, CO2 13, BUN 63, creatinine 2.06, glucose 101.   ASSESSMENT AND PLAN: This is a 54 year old white male with acute renal failure. We will plan on placing a dialysis catheter at the bedside. This is a level-3 consultation.    ____________________________ Annice Needy, MD [id number] D: 10/21/2011 15:58:47 ET T: 10/21/2011 17:37:31 ET  JOB#: [id number]  cc: Annice NeedyJason S. Dew, MD, <Dictator> Annice NeedyJASON S DEW MD ELECTRONICALLY SIGNED 10/22/2011 8:13
